# Patient Record
Sex: Male | Born: 1955 | Race: White | Hispanic: No | Marital: Married | Smoking: Never smoker
Health system: Southern US, Community
[De-identification: ages and names within clinical notes are randomized; demographics above are authoritative.]

## PROBLEM LIST (undated history)

## (undated) DIAGNOSIS — R972 Elevated prostate specific antigen [PSA]: Secondary | ICD-10-CM

## (undated) DIAGNOSIS — G5620 Lesion of ulnar nerve, unspecified upper limb: Secondary | ICD-10-CM

## (undated) DIAGNOSIS — K219 Gastro-esophageal reflux disease without esophagitis: Secondary | ICD-10-CM

## (undated) DIAGNOSIS — E785 Hyperlipidemia, unspecified: Secondary | ICD-10-CM

## (undated) DIAGNOSIS — N529 Male erectile dysfunction, unspecified: Secondary | ICD-10-CM

## (undated) DIAGNOSIS — R7301 Impaired fasting glucose: Secondary | ICD-10-CM

## (undated) DIAGNOSIS — M79662 Pain in left lower leg: Secondary | ICD-10-CM

## (undated) DIAGNOSIS — M7989 Other specified soft tissue disorders: Secondary | ICD-10-CM

## (undated) DIAGNOSIS — N138 Other obstructive and reflux uropathy: Secondary | ICD-10-CM

## (undated) DIAGNOSIS — J3089 Other allergic rhinitis: Secondary | ICD-10-CM

## (undated) DIAGNOSIS — N401 Enlarged prostate with lower urinary tract symptoms: Secondary | ICD-10-CM

## (undated) DIAGNOSIS — Z86010 Personal history of colonic polyps: Secondary | ICD-10-CM

## (undated) HISTORY — DX: Other specified soft tissue disorders: M79.89

## (undated) HISTORY — DX: Elevated prostate specific antigen (PSA): R97.20

## (undated) HISTORY — DX: Gastro-esophageal reflux disease without esophagitis: K21.9

## (undated) HISTORY — DX: Lesion of ulnar nerve, unspecified upper limb: G56.20

## (undated) HISTORY — DX: Other allergic rhinitis: J30.89

## (undated) HISTORY — DX: Male erectile dysfunction, unspecified: N52.9

## (undated) HISTORY — PX: COLONOSCOPY: SHX174

## (undated) HISTORY — DX: Other obstructive and reflux uropathy: N40.1

## (undated) HISTORY — DX: Personal history of colonic polyps: Z86.010

## (undated) HISTORY — DX: Hyperlipidemia, unspecified: E78.5

## (undated) HISTORY — DX: Pain in left lower leg: M79.662

## (undated) HISTORY — DX: Other obstructive and reflux uropathy: N13.8

## (undated) HISTORY — PX: LUMBAR DISC SURGERY: SHX700

## (undated) HISTORY — DX: Impaired fasting glucose: R73.01

---

## 2004-04-15 ENCOUNTER — Ambulatory Visit: Payer: Self-pay | Admitting: *Deleted

## 2007-04-24 ENCOUNTER — Ambulatory Visit (HOSPITAL_COMMUNITY): Admission: RE | Admit: 2007-04-24 | Discharge: 2007-04-24 | Payer: Self-pay | Admitting: Family Medicine

## 2007-06-14 ENCOUNTER — Ambulatory Visit (HOSPITAL_COMMUNITY): Admission: RE | Admit: 2007-06-14 | Discharge: 2007-06-15 | Payer: Self-pay | Admitting: Neurosurgery

## 2008-06-17 ENCOUNTER — Telehealth: Payer: Self-pay | Admitting: Internal Medicine

## 2008-07-26 DIAGNOSIS — Z86010 Personal history of colonic polyps: Secondary | ICD-10-CM

## 2008-07-26 DIAGNOSIS — Z860101 Personal history of adenomatous and serrated colon polyps: Secondary | ICD-10-CM

## 2008-07-26 HISTORY — DX: Personal history of colonic polyps: Z86.010

## 2008-07-26 HISTORY — DX: Personal history of adenomatous and serrated colon polyps: Z86.0101

## 2008-08-05 ENCOUNTER — Telehealth (INDEPENDENT_AMBULATORY_CARE_PROVIDER_SITE_OTHER): Payer: Self-pay | Admitting: *Deleted

## 2008-08-08 ENCOUNTER — Encounter: Payer: Self-pay | Admitting: Internal Medicine

## 2008-08-08 ENCOUNTER — Ambulatory Visit: Payer: Self-pay | Admitting: Internal Medicine

## 2008-08-13 ENCOUNTER — Encounter: Payer: Self-pay | Admitting: Internal Medicine

## 2010-12-08 NOTE — Op Note (Signed)
NAME:  Steve Cunningham, Steve Cunningham NO.:  [id number]   MEDICAL RECORD NO.:  [id number]          PATIENT TYPE:  OIB   LOCATION:  3021                         FACILITY:  [facility]   PHYSICIAN:  Payton Doughty, M.D.      DATE OF BIRTH:  [birth year]   DATE OF PROCEDURE:  06/14/2007  DATE OF DISCHARGE:  06/15/2007                               OPERATIVE REPORT   PREOPERATIVE DIAGNOSIS:  Foraminal disk on the left side at L5-S1.   POSTOPERATIVE DIAGNOSIS:  Foraminal disk on the left side at L5-S1.   PROCEDURE:  Left L5-S1 laminectomy and diskectomy.   ANESTHESIA:  General endotracheal.   PREP:  Sterile Betadine prep and scrub with alcohol wipe.   COMPLICATIONS:  None.   NURSE ASSISTANT:  [place].   DOCTOR ASSISTANT:  Danae Orleans. Venetia Maxon, M.D.   BODY OF TEXT:  This is a 55 year old gentleman with left L5  radiculopathy related to foraminal disk at 5-1 on the left, taken to  operating room, smoothly anesthetized, intubated, placed prone on the  operating table.  Following shave, prep, draped in usual sterile fashion  skin was infiltrated with 1% lidocaine 1:400,000 epinephrine.  The skin  was incised directly over the L5 lamina.  The transverse process of L5,  the pars interarticularis and inferior facet of L5 were identified and x-  ray confirmed correctness of level.  Using a high-speed drill and the  lateral part of the pars interarticularis and the superior portion of  the inferior articular facet of L5 and the superior portion of the  superior articular facet of S1 were removed.  This allowed access to the  foraminal area.  The L5 root was demonstrated, careful dissection  inferolateral to it demonstrated the herniated disk fragment.  Microscope was used and the fragments dissected free and removed without  difficulty.  Wound was irrigated.  Hemostasis assured.  Depo-Medrol  soaked fat was placed in the laminotomy defect.  Successive layers of 0  Vicryl, 2-0 Vicryl, 4-0 Vicryl  were used to close.  Benzoin, Steri-  Strips were placed, made occlusive with Telfa and OpSite.  The patient  returned to recovery room in good condition.           ______________________________  Payton Doughty, M.D.     MWR/MEDQ  D:  06/14/2007  T:  06/15/2007  Job:  [id number]

## 2010-12-08 NOTE — H&P (Signed)
NAME:  Steve Cunningham, Steve Cunningham NO.:  [id number]   MEDICAL RECORD NO.:  [id number]          PATIENT TYPE:  OIB   LOCATION:  3021                         FACILITY:  [facility]   PHYSICIAN:  Payton Doughty, M.D.      DATE OF BIRTH:  [birth year]   DATE OF ADMISSION:  06/14/2007  DATE OF DISCHARGE:  06/15/2007                              HISTORY & PHYSICAL   ADMITTING DIAGNOSIS:  Herniated disk L5-S1 in the frontal position on  the left side.   SERVICE:  Neurosurgery   A very nice 55 year old right-handed white gentleman who about 2 months  began to experience pain in his left leg, noted some numbness down the  lateral aspect of his left foot.  MR showed a foraminal disk off the  left side.  He had foraminal steroid injection that helped transiently,  still has left leg pain.  He is admitted now for a foraminal diskectomy.   MEDICAL HISTORY:  Benign.  He takes Pravachol, Allegra, Motrin,  Neurontin and prednisone, which he has finished.   ALLERGIES:  None.   SURGICAL HISTORY:  None.   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   FAMILY HISTORY:  Parents are 64 in good health with hyperlipidemia   REVIEW OF SYSTEMS:  Remarkable for high cholesterol and his leg pain.   HEENT EXAM:  Within normal limits.  He has good range of motion of the  neck.  CHEST:  Clear.  CARDIAC:  Regular rate and rhythm.  ABDOMEN:  Nontender with no hepatosplenomegaly.  EXTREMITIES:  Without clubbing or cyanosis.  GENITOURINARY:  Deferred.  PERIPHERAL PULSES:  Good.  NEUROLOGICAL:  He is awake, alert and oriented.  His cranial nerves are  intact.  Motor exam shows 5/5 strength throughout the upper and lower  extremities.  Sensory dysesthesia is described in the left L5  distribution.  Reflexes are 1 at the knees, 1 at the ankles.  Straight  leg raise is positive on the left.   MR shows foraminal disk with elevation of the left L5 root at  5-1.   CLINICAL IMPRESSION:  Left L5 radiculopathy related to disk.  He has  failed conservative management.  The plan is for foraminal diskectomy on  the left side.  The risks and benefits have been discussed with him and  he wish to proceed.           ______________________________  Payton Doughty, M.D.     MWR/MEDQ  D:  06/14/2007  T:  06/15/2007  Job:  [id number]

## 2011-05-04 LAB — DIFFERENTIAL
Eosinophils Absolute: 0.1 — ABNORMAL LOW
Eosinophils Relative: 1
Lymphocytes Relative: 29
Lymphs Abs: 1.8
Monocytes Absolute: 0.4
Monocytes Relative: 7

## 2011-05-04 LAB — CBC
Hemoglobin: 14.7
MCV: 91.4
RDW: 13.1
WBC: 6.3

## 2011-05-04 LAB — COMPREHENSIVE METABOLIC PANEL
ALT: 33
BUN: 12
CO2: 29
Chloride: 98
GFR calc Af Amer: >60
Glucose, Bld: 101 — ABNORMAL HIGH
Potassium: 3.6
Total Bilirubin: 0.8
Total Protein: 7.8

## 2011-05-04 LAB — APTT: aPTT: 28

## 2012-02-03 ENCOUNTER — Ambulatory Visit (INDEPENDENT_AMBULATORY_CARE_PROVIDER_SITE_OTHER): Payer: 59 | Admitting: Otolaryngology

## 2012-02-03 DIAGNOSIS — H6120 Impacted cerumen, unspecified ear: Secondary | ICD-10-CM

## 2012-02-03 DIAGNOSIS — H9319 Tinnitus, unspecified ear: Secondary | ICD-10-CM

## 2012-02-03 DIAGNOSIS — H903 Sensorineural hearing loss, bilateral: Secondary | ICD-10-CM

## 2012-11-08 ENCOUNTER — Ambulatory Visit (INDEPENDENT_AMBULATORY_CARE_PROVIDER_SITE_OTHER): Payer: 59 | Admitting: Family Medicine

## 2012-11-08 ENCOUNTER — Encounter: Payer: Self-pay | Admitting: Family Medicine

## 2012-11-08 VITALS — BP 122/72 | HR 72 | Temp 97.6°F | Wt 178.0 lb

## 2012-11-08 DIAGNOSIS — E785 Hyperlipidemia, unspecified: Secondary | ICD-10-CM

## 2012-11-08 DIAGNOSIS — Z0000 Encounter for general adult medical examination without abnormal findings: Secondary | ICD-10-CM

## 2012-11-08 DIAGNOSIS — N529 Male erectile dysfunction, unspecified: Secondary | ICD-10-CM | POA: Insufficient documentation

## 2012-11-08 DIAGNOSIS — Z125 Encounter for screening for malignant neoplasm of prostate: Secondary | ICD-10-CM

## 2012-11-08 DIAGNOSIS — Z0389 Encounter for observation for other suspected diseases and conditions ruled out: Secondary | ICD-10-CM

## 2012-11-08 MED ORDER — PRAVASTATIN SODIUM 40 MG PO TABS: 40.0000 mg | ORAL_TABLET | Freq: Every day | ORAL | Status: DC

## 2012-11-08 MED ORDER — FLUTICASONE PROPIONATE 50 MCG/ACT NA SUSP: NASAL | Status: DC

## 2012-11-08 MED ORDER — SILDENAFIL CITRATE 100 MG PO TABS: 50.0000 mg | ORAL_TABLET | Freq: Every day | ORAL | Status: DC | PRN

## 2012-11-08 MED ORDER — LORATADINE 10 MG PO TABS: 10.0000 mg | ORAL_TABLET | Freq: Every day | ORAL | Status: DC

## 2012-11-08 NOTE — Assessment & Plan Note (Signed)
Will check testosterone level with upcoming fasting labs. Viagra 100mg , 1/2-1 qd prn, #5, RF x 5--given today (printed).

## 2012-11-08 NOTE — Assessment & Plan Note (Signed)
Continue current med, rx RFd. He'll get fasting health panel labs, PSA, and testosterone level at his earliest convenience at [facility] in [place]. He'll then make appt for CPE here.

## 2012-11-08 NOTE — Progress Notes (Signed)
Office Note 11/08/2012  CC:  Chief Complaint  Patient presents with  . Establish Care    refill cholesterol med    HPI:  Steve Cunningham is a 57 y.o. White male who is here to establish care. Patient's most recent primary MD: Dr. Lilyan Punt. Old records were not reviewed prior to or during today's visit.  Pt feeling well.  His only complaint is some intermittent erectile dysfunction.  Intermittent decreased libido, energy level decline, and moodiness. He wonders about his testosterone level, has never had it tested.   Last labs reviewed (09/2011): Tchol 175, trigs 141, HDL47, LDL 100. TSH 4.3, PSA 1.19.  Has been doing well on pravastatin.  He is overdue for CPE.  Past Medical History  Diagnosis Date  . Hyperlipidemia     Past Surgical History  Procedure Laterality Date  . Lumbar disc surgery  approx 2011    L5-S1 (left); Dr. Channing Mutters    Family History  Problem Relation Age of Onset  . Hyperlipidemia Mother   . Hypertension Mother   . Hyperlipidemia Father   . Hypertension Father     History   Social History  . Marital Status: Married    Spouse Name: N/A    Number of Children: N/A  . Years of Education: N/A   Occupational History  . Not on file.   Social History Main Topics  . Smoking status: Never Smoker   . Smokeless tobacco: Never Used  . Alcohol Use: No  . Drug Use: No  . Sexually Active: Not on file   Other Topics Concern  . Not on file   Social History Narrative   Married, 1 son and 1 daughter, both college age.   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   College/med school: [employer].   No T/A/Ds.          Outpatient Encounter Prescriptions as of 11/08/2012  Medication Sig Dispense Refill  . fluticasone (FLONASE) 50 MCG/ACT nasal spray Place 2 sprays into each nostril daily.  48 g  4  . loratadine (CLARITIN) 10 MG tablet Take 1 tablet (10 mg total) by mouth daily.  90 tablet  4  . pravastatin  (PRAVACHOL) 40 MG tablet Take 1 tablet (40 mg total) by mouth daily.  90 tablet  4  . [DISCONTINUED] fluticasone (FLONASE) 50 MCG/ACT nasal spray Place 2 sprays into each nostril daily.      . [DISCONTINUED] loratadine (CLARITIN) 10 MG tablet Take 10 mg by mouth daily.      . [DISCONTINUED] pravastatin (PRAVACHOL) 40 MG tablet Take 40 mg by mouth daily.      . sildenafil (VIAGRA) 100 MG tablet Take 0.5-1 tablets (50-100 mg total) by mouth daily as needed for erectile dysfunction.  5 tablet  11   No facility-administered encounter medications on file as of 11/08/2012.    No Known Allergies  ROS Review of Systems  Constitutional: Negative for fever and fatigue.  HENT: Negative for congestion and sore throat.   Eyes: Negative for visual disturbance.  Respiratory: Negative for cough.   Cardiovascular: Negative for chest pain.  Gastrointestinal: Negative for nausea and abdominal pain.  Genitourinary: Negative for dysuria.  Musculoskeletal: Negative for back pain and joint swelling.  Skin: Negative for rash.  Neurological: Negative for weakness and headaches.  Hematological: Negative for adenopathy.    PE; Blood pressure 122/72, pulse 72, temperature 97.6 F (36.4 C), temperature source Temporal, weight 178 lb (80.74 kg), SpO2 98.00%. Gen: Alert,  well appearing.  Patient is oriented to person, place, time, and situation. CV: RRR, no m/r/g.   LUNGS: CTA bilat, nonlabored resps, good aeration in all lung fields. EXT: no clubbing, cyanosis, or edema.   Pertinent labs:  None today (see HPI)  ASSESSMENT AND PLAN:   New Pt, establishing care.  Hyperlipidemia Continue current med, rx RFd. He'll get fasting health panel labs, PSA, and testosterone level at his earliest convenience at [facility] in [place]. He'll then make appt for CPE here.  Erectile dysfunction Will check testosterone level with upcoming fasting labs. Viagra 100mg , 1/2-1 qd prn, #5, RF x 5--given today  (printed).  An After Visit Summary was printed and given to the patient.  Return for Return at your earliest convenience for CPE.

## 2013-07-31 ENCOUNTER — Encounter: Payer: Self-pay | Admitting: Internal Medicine

## 2013-12-12 ENCOUNTER — Other Ambulatory Visit: Payer: Self-pay | Admitting: Family Medicine

## 2014-01-01 ENCOUNTER — Encounter: Payer: Self-pay | Admitting: Internal Medicine

## 2014-06-13 ENCOUNTER — Encounter: Payer: Self-pay | Admitting: Family Medicine

## 2014-06-13 ENCOUNTER — Ambulatory Visit (INDEPENDENT_AMBULATORY_CARE_PROVIDER_SITE_OTHER): Payer: 59 | Admitting: Family Medicine

## 2014-06-13 ENCOUNTER — Other Ambulatory Visit: Payer: Self-pay | Admitting: Family Medicine

## 2014-06-13 VITALS — BP 123/80 | HR 68 | Temp 97.8°F | Resp 16 | Ht 75.0 in | Wt 174.0 lb

## 2014-06-13 DIAGNOSIS — L819 Disorder of pigmentation, unspecified: Secondary | ICD-10-CM | POA: Insufficient documentation

## 2014-06-13 DIAGNOSIS — Z125 Encounter for screening for malignant neoplasm of prostate: Secondary | ICD-10-CM

## 2014-06-13 DIAGNOSIS — E785 Hyperlipidemia, unspecified: Secondary | ICD-10-CM

## 2014-06-13 DIAGNOSIS — N5201 Erectile dysfunction due to arterial insufficiency: Secondary | ICD-10-CM

## 2014-06-13 DIAGNOSIS — Z0000 Encounter for general adult medical examination without abnormal findings: Secondary | ICD-10-CM

## 2014-06-13 DIAGNOSIS — J309 Allergic rhinitis, unspecified: Secondary | ICD-10-CM

## 2014-06-13 DIAGNOSIS — J3089 Other allergic rhinitis: Secondary | ICD-10-CM

## 2014-06-13 MED ORDER — SILDENAFIL CITRATE 100 MG PO TABS: 50.0000 mg | ORAL_TABLET | Freq: Every day | ORAL | Status: DC | PRN

## 2014-06-13 MED ORDER — LORATADINE 10 MG PO TABS: 10.0000 mg | ORAL_TABLET | Freq: Every day | ORAL | Status: DC

## 2014-06-13 MED ORDER — PRAVASTATIN SODIUM 40 MG PO TABS: 40.0000 mg | ORAL_TABLET | Freq: Every day | ORAL | Status: DC

## 2014-06-13 NOTE — Progress Notes (Signed)
OFFICE VISIT  06/13/2014   CC:  Chief Complaint  Patient presents with  . Medication Refill   HPI:    Patient is a 58 y.o. Caucasian male who presents for routine med f/u, needs RFs of pravastatin, viagra, and claritin. Has a skin lesion he wants me to take a look at.  Left shoulder, very dark pigment, no known growth or other changes lately. 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  Says he is doing well with claritin year round, only takes flonase in spring and fall.  Compliant with pravachol, says he got labs in the spring on "Doctor's Day" and they were fine. However, he asks for fasting lab panel to be ordered for future so [facility] can [facility] and then return here in near future for his CPE.  Asks for more viagra, says this med helps very well for his ED.   Past Medical History  Diagnosis Date  . Hyperlipidemia   . Erectile dysfunction   . Perennial allergic rhinitis     Past Surgical History  Procedure Laterality Date  . Lumbar disc surgery  approx 2011    L5-S1 (left); Dr. Channing Muttersoy    Outpatient Prescriptions Prior to Visit  Medication Sig Dispense Refill  . fluticasone (FLONASE) 50 MCG/ACT nasal spray Place 2 sprays into each nostril daily. 48 g 4  . loratadine (CLARITIN) 10 MG tablet Take 1 tablet (10 mg total) by mouth daily. 90 tablet 4  . pravastatin (PRAVACHOL) 40 MG tablet Take 1 tablet (40 mg total) by mouth daily. 90 tablet 4  . sildenafil (VIAGRA) 100 MG tablet Take 0.5-1 tablets (50-100 mg total) by mouth daily as needed for erectile dysfunction. 5 tablet 11   No facility-administered medications prior to visit.    No Known Allergies  ROS As per HPI  PE: Blood pressure 123/80, pulse 68, temperature 97.8 F (36.6 C), temperature source Temporal, resp. rate 16, height 6\' 3"  (1.905 m), weight 174 lb (78.926 kg), SpO2 100 %. Gen: Alert, well appearing.  Patient is oriented to person, place,  time, and situation. SKIN: left chest wall, upper pectoralis region with 1 mm oval, black macule.  LABS:  None today  IMPRESSION AND PLAN:  Atypical pigmented skin lesion Removed entirely by punch biopsy today but margin was about 1mm at the most. If returns + for melanoma then he'll need additional marginal excision/dermatologist referral.  Used Betadine for cleansing and 1% Lidocaine with epinephrine for anesthetic, with sterile technique a 4 mm punch biopsy was used to encompass the entire lesion and remove the lesion + a 1mm circumferential border around the lesion.  Hemostasis was obtained by pressure and wound was sutured with 1 4-0 ethilon suture. Dressing was applied and wound care instructions provided. Be alert for any signs of cutaneous infection. The specimen is labeled and sent to pathology for evaluation. The procedure was well tolerated without complications.   Hyperlipidemia The current medical regimen is effective;  continue present plan and medications. RF's done. FLP and CMET future ordered.  Erectile dysfunction Viagra helpful. RF'd this med today.  Perennial allergic rhinitis The current medical regimen is effective;  continue present plan and medications.    An After Visit Summary was printed and given to the patient.  FOLLOW UP: Return for return for CPE at your convenience. Health panel + PSA ordered as future labs to be obtained at [facility] in [place].

## 2014-06-13 NOTE — Assessment & Plan Note (Signed)
Removed entirely by punch biopsy today but margin was about 1mm at the most. If returns + for melanoma then he'll need additional marginal excision/dermatologist referral.  Used Betadine for cleansing and 1% Lidocaine with epinephrine for anesthetic, with sterile technique a 4 mm punch biopsy was used to encompass the entire lesion and remove the lesion + a 1mm circumferential border around the lesion.  Hemostasis was obtained by pressure and wound was sutured with 1 4-0 ethilon suture. Dressing was applied and wound care instructions provided. Be alert for any signs of cutaneous infection. The specimen is labeled and sent to pathology for evaluation. The procedure was well tolerated without complications.

## 2014-06-13 NOTE — Progress Notes (Signed)
Pre visit review using our clinic review tool, if applicable. No additional management support is needed unless otherwise documented below in the visit note. 

## 2014-06-13 NOTE — Assessment & Plan Note (Signed)
Viagra helpful. RF'd this med today.

## 2014-06-13 NOTE — Assessment & Plan Note (Signed)
The current medical regimen is effective;  continue present plan and medications.  

## 2014-06-13 NOTE — Assessment & Plan Note (Signed)
The current medical regimen is effective;  continue present plan and medications. RF's done. FLP and CMET future ordered.

## 2015-04-01 ENCOUNTER — Encounter: Payer: Self-pay | Admitting: Internal Medicine

## 2015-05-16 ENCOUNTER — Other Ambulatory Visit: Payer: Self-pay

## 2015-05-16 MED ORDER — PENICILLIN V POTASSIUM 500 MG PO TABS: ORAL_TABLET | ORAL | Status: DC

## 2015-05-21 ENCOUNTER — Ambulatory Visit (AMBULATORY_SURGERY_CENTER): Payer: Self-pay

## 2015-05-21 VITALS — Ht 74.0 in | Wt 177.0 lb

## 2015-05-21 DIAGNOSIS — Z86010 Personal history of colonic polyps: Secondary | ICD-10-CM

## 2015-05-21 MED ORDER — NA SULFATE-K SULFATE-MG SULF 17.5-3.13-1.6 GM/177ML PO SOLN: 1.0000 | Freq: Once | ORAL | Status: DC

## 2015-05-21 NOTE — Progress Notes (Signed)
No egg or soy allergies Not on home 02 No previous anesthesia complications No diet or weight loss meds 

## 2015-06-05 ENCOUNTER — Encounter: Payer: 59 | Admitting: Internal Medicine

## 2015-06-16 ENCOUNTER — Other Ambulatory Visit: Payer: Self-pay | Admitting: Family Medicine

## 2015-08-27 ENCOUNTER — Ambulatory Visit (AMBULATORY_SURGERY_CENTER): Payer: 59 | Admitting: Internal Medicine

## 2015-08-27 ENCOUNTER — Encounter: Payer: Self-pay | Admitting: Internal Medicine

## 2015-08-27 VITALS — BP 118/73 | HR 55 | Temp 97.3°F | Resp 17 | Ht 74.0 in | Wt 177.0 lb

## 2015-08-27 DIAGNOSIS — Z86010 Personal history of colonic polyps: Secondary | ICD-10-CM

## 2015-08-27 MED ORDER — SODIUM CHLORIDE 0.9 % IV SOLN: 500.0000 mL | INTRAVENOUS | Status: DC

## 2015-08-27 NOTE — Patient Instructions (Signed)
YOU HAD AN ENDOSCOPIC PROCEDURE TODAY AT THE [facility]:   Refer to the procedure report that was given to you for any specific questions about what was found during the examination.  If the procedure report does not answer your questions, please call your gastroenterologist to clarify.  If you requested that your care partner not be given the details of your procedure findings, then the procedure report has been included in a sealed envelope for you to review at your convenience later.  YOU SHOULD EXPECT: Some feelings of bloating in the abdomen. Passage of more gas than usual.  Walking can help get rid of the air that was put into your GI tract during the procedure and reduce the bloating. If you had a lower endoscopy (such as a colonoscopy or flexible sigmoidoscopy) you may notice spotting of blood in your stool or on the toilet paper. If you underwent a bowel prep for your procedure, you may not have a normal bowel movement for a few days.  Please Note:  You might notice some irritation and congestion in your nose or some drainage.  This is from the oxygen used during your procedure.  There is no need for concern and it should clear up in a day or so.  SYMPTOMS TO REPORT IMMEDIATELY:   Following lower endoscopy (colonoscopy or flexible sigmoidoscopy):  Excessive amounts of blood in the stool  Significant tenderness or worsening of abdominal pains  Swelling of the abdomen that is new, acute  Fever of 100F or higher    For urgent or emergent issues, a gastroenterologist can be reached at any hour by calling [phone].   DIET: Your first meal following the procedure should be a small meal and then it is ok to progress to your normal diet. Heavy or fried foods are harder to digest and may make you feel nauseous or bloated.  Likewise, meals heavy in dairy and vegetables can increase bloating.  Drink plenty of fluids but you should avoid alcoholic beverages for 24  hours.  ACTIVITY:  You should plan to take it easy for the rest of today and you should NOT DRIVE or use heavy machinery until tomorrow (because of the sedation medicines used during the test).    FOLLOW UP: Our staff will call the number listed on your records the next business day following your procedure to check on you and address any questions or concerns that you may have regarding the information given to you following your procedure. If we do not reach you, we will leave a message.  However, if you are feeling well and you are not experiencing any problems, there is no need to return our call.  We will assume that you have returned to your regular daily activities without incident.  If any biopsies were taken you will be contacted by phone or by letter within the next 1-3 weeks.  Please call us at [phone] if you have not heard about the biopsies in 3 weeks.    SIGNATURES/CONFIDENTIALITY: You and/or your care partner have signed paperwork which will be entered into your electronic medical record.  These signatures attest to the fact that that the information above on your After Visit Summary has been reviewed and is understood.  Full responsibility of the confidentiality of this discharge information lies with you and/or your care-partner.   Resume medications. Information given on diverticuclosis and high fiber diet.

## 2015-08-27 NOTE — Progress Notes (Signed)
A/ox3 pleased with MAC, report to Sheila RN 

## 2015-08-27 NOTE — Op Note (Signed)
[facility] [address] [place], [address]   COLONOSCOPY PROCEDURE REPORT  PATIENT: Steve Cunningham, Steve Cunningham  MR#: [id number] BIRTHDATE: 1955/09/18 , 59  yrs. old GENDER: male ENDOSCOPIST: Roxy Cedar, MD REFERRED AO:[id number] Program Recall PROCEDURE DATE:  08/27/2015 PROCEDURE:   Colonoscopy, surveillance First Screening Colonoscopy - Avg.  risk and is 50 yrs.  old or older - No.  Prior Negative Screening - Now for repeat screening. N/A  History of Adenoma - Now for follow-up colonoscopy & has been > or = to 3 yrs.  Yes hx of adenoma.  Has been 3 or more years since last colonoscopy.  Polyps removed today? No Recommend repeat exam, <10 yrs? No ASA CLASS:   Class II INDICATIONS:Surveillance due to prior colonic neoplasia and PH Colon Adenoma.  . Index examination January 2010 with diminutive tubular adenoma MEDICATIONS: Monitored anesthesia care and Propofol 250 mg IV  DESCRIPTION OF PROCEDURE:   After the risks benefits and alternatives of the procedure were thoroughly explained, informed consent was obtained.  The digital rectal exam revealed no abnormalities of the rectum.   The LB CF-H180AL Loaner [id number] endoscope was introduced through the anus and advanced to the cecum, which was identified by both the appendix and ileocecal valve. No adverse events experienced.   The quality of the prep was good.  (Suprep was used)  The instrument was then slowly withdrawn as the colon was fully examined. Estimated blood loss is zero unless otherwise noted in this procedure report.   COLON FINDINGS: There was mild diverticulosis noted in the sigmoid colon.   The examination was otherwise normal. No polyps. Retroflexed views revealed internal hemorrhoids. The time to cecum = 2.5 Withdrawal time = 18.6   The scope was withdrawn and the procedure completed. COMPLICATIONS: There were no immediate complications.  ENDOSCOPIC IMPRESSION: 1.   Mild diverticulosis was  noted in the sigmoid colon 2.   The examination was otherwise normal  RECOMMENDATIONS: 1. Continue current colorectal surveillance recommendations with a repeat colonoscopy in 10 years.  eSigned:  Roxy Cedar, MD 08/27/2015 9:24 AM   cc: Simone Curia, Md and Earley Favor, MD

## 2015-08-28 ENCOUNTER — Telehealth: Payer: Self-pay | Admitting: *Deleted

## 2015-08-28 NOTE — Telephone Encounter (Signed)
  Follow up Call-  Call back number 08/27/2015  Post procedure Call Back phone  # [phone]  Permission to leave phone message Yes    No message left; this number was to [facility]

## 2015-09-23 MED FILL — PRAVASTATIN NA 40 MG TAB: 40 | 90 days supply | Qty: 90 | Fill #1

## 2015-12-26 MED FILL — PRAVASTATIN NA 40 MG TAB: 40 | 90 days supply | Qty: 90 | Fill #2

## 2016-04-21 MED FILL — PRAVASTATIN NA 40 MG TAB: 40 | 90 days supply | Qty: 90 | Fill #3

## 2016-06-08 ENCOUNTER — Other Ambulatory Visit: Payer: Self-pay | Admitting: *Deleted

## 2016-06-08 MED ORDER — SULFACETAMIDE SODIUM 10 % OP SOLN: 2.0000 [drp] | Freq: Four times a day (QID) | OPHTHALMIC | 0 refills | Status: DC

## 2016-06-21 ENCOUNTER — Other Ambulatory Visit: Payer: Self-pay | Admitting: *Deleted

## 2016-06-21 MED ORDER — AMOXICILLIN-POT CLAVULANATE 875-125 MG PO TABS: 1.0000 | ORAL_TABLET | Freq: Two times a day (BID) | ORAL | 0 refills | Status: DC

## 2016-06-21 NOTE — Progress Notes (Unsigned)
Aug 875

## 2016-06-24 ENCOUNTER — Other Ambulatory Visit: Payer: Self-pay

## 2016-06-24 MED ORDER — PREDNISONE 10 MG PO TABS: ORAL_TABLET | ORAL | 0 refills | Status: DC

## 2016-07-08 DIAGNOSIS — H52223 Regular astigmatism, bilateral: Secondary | ICD-10-CM | POA: Diagnosis not present

## 2016-07-08 DIAGNOSIS — H5213 Myopia, bilateral: Secondary | ICD-10-CM | POA: Diagnosis not present

## 2016-07-08 DIAGNOSIS — H524 Presbyopia: Secondary | ICD-10-CM | POA: Diagnosis not present

## 2016-07-22 ENCOUNTER — Other Ambulatory Visit: Payer: Self-pay | Admitting: Family Medicine

## 2016-07-22 MED FILL — PRAVASTATIN NA 40 MG TAB: 40 | 90 days supply | Qty: 90 | Fill #0

## 2016-07-22 NOTE — Telephone Encounter (Signed)
Will do 1 yr of RF's, but remind pt to try to get in for a fasting physical exam sometime in the next year.--thx

## 2016-07-22 NOTE — Telephone Encounter (Signed)
RF request for pravastatin LOV: 06/13/14 Next ov: None Last written: 06/16/15 #90 w/ 4RF  Please advise. Thanks.

## 2016-07-23 NOTE — Telephone Encounter (Signed)
Pt advised and voiced understanding.   

## 2016-10-01 ENCOUNTER — Other Ambulatory Visit: Payer: Self-pay

## 2016-10-01 MED ORDER — AMOXICILLIN-POT CLAVULANATE 875-125 MG PO TABS: 1.0000 | ORAL_TABLET | Freq: Two times a day (BID) | ORAL | 0 refills | Status: DC

## 2016-10-01 MED ORDER — PREDNISONE 20 MG PO TABS: ORAL_TABLET | ORAL | 0 refills | Status: DC

## 2016-10-14 LAB — HEPATIC FUNCTION PANEL
ALK PHOS: 36 (ref 25–125)
ALT: 40 (ref 10–40)
AST: 32 (ref 14–40)
Bilirubin, Total: 0.7

## 2016-10-14 LAB — LIPID PANEL
CHOLESTEROL: 190 (ref 0–200)
HDL: 43 (ref 35–70)
LDL CALC: 100
Triglycerides: 234 — AB (ref 40–160)

## 2016-10-14 LAB — TSH: TSH: 4.42 (ref <=5.90)

## 2016-10-14 LAB — BASIC METABOLIC PANEL
BUN: 18 (ref 4–21)
Creatinine: 1.1 (ref 0.6–1.3)
Glucose: 98
POTASSIUM: 3.6 (ref 3.4–5.3)
Sodium: 137 (ref 137–147)

## 2016-10-14 LAB — PSA: PSA: 1.89

## 2016-10-14 LAB — CBC AND DIFFERENTIAL
HEMATOCRIT: 40 — AB (ref 41–53)
HEMOGLOBIN: 14 (ref 13.5–17.5)
Platelets: 226 (ref 150–399)
WBC: 5.1

## 2016-10-22 MED FILL — PRAVASTATIN NA 40 MG TAB: 40 | 90 days supply | Qty: 90 | Fill #1

## 2017-01-13 ENCOUNTER — Ambulatory Visit (INDEPENDENT_AMBULATORY_CARE_PROVIDER_SITE_OTHER): Payer: 59 | Admitting: Family Medicine

## 2017-01-13 ENCOUNTER — Encounter: Payer: Self-pay | Admitting: Family Medicine

## 2017-01-13 VITALS — BP 108/65 | HR 69 | Temp 97.7°F | Resp 16 | Ht 75.0 in | Wt 176.2 lb

## 2017-01-13 DIAGNOSIS — K219 Gastro-esophageal reflux disease without esophagitis: Secondary | ICD-10-CM

## 2017-01-13 DIAGNOSIS — N5201 Erectile dysfunction due to arterial insufficiency: Secondary | ICD-10-CM

## 2017-01-13 DIAGNOSIS — G5622 Lesion of ulnar nerve, left upper limb: Secondary | ICD-10-CM | POA: Diagnosis not present

## 2017-01-13 DIAGNOSIS — Z0000 Encounter for general adult medical examination without abnormal findings: Secondary | ICD-10-CM | POA: Diagnosis not present

## 2017-01-13 DIAGNOSIS — E782 Mixed hyperlipidemia: Secondary | ICD-10-CM

## 2017-01-13 DIAGNOSIS — R400 Somnolence: Secondary | ICD-10-CM

## 2017-01-13 DIAGNOSIS — Z125 Encounter for screening for malignant neoplasm of prostate: Secondary | ICD-10-CM | POA: Diagnosis not present

## 2017-01-13 MED ORDER — MODAFINIL 200 MG PO TABS: ORAL_TABLET | ORAL | 0 refills | Status: DC

## 2017-01-13 MED ORDER — RANITIDINE HCL 300 MG PO CAPS: ORAL_CAPSULE | ORAL | 3 refills | Status: DC

## 2017-01-13 MED ORDER — TADALAFIL 20 MG PO TABS: 20.0000 mg | ORAL_TABLET | Freq: Every day | ORAL | 11 refills | Status: DC | PRN

## 2017-01-13 MED ORDER — PRAVASTATIN SODIUM 40 MG PO TABS: 40.0000 mg | ORAL_TABLET | Freq: Every day | ORAL | 3 refills | Status: DC

## 2017-01-13 MED FILL — raNITIdine HCL 300 MG TABS: 300 | 90 days supply | Qty: 90 | Fill #0

## 2017-01-13 MED FILL — PRAVASTATIN NA 40 MG TAB: 40 | 90 days supply | Qty: 90 | Fill #0

## 2017-01-13 NOTE — Progress Notes (Signed)
OFFICE VISIT  01/13/2017   CC:  Chief Complaint  Patient presents with  . Follow-up    HLD and GERD, pt brought in labs done 6 weeks ago   HPI:    Patient is a 61 y.o. Caucasian male who presents for f/u hypercholesterolemia and GERD. Has recent labs: all normal including CBC, CMET, FLP, TSH, and PSA.  HLD: tolerating prava 40mg  qd well. GERD: recent dental visit showed some enamel erosion c/w acid reflux changes. Pt decreased beer intake from 2 per night to 1 per night and has started ranitidine 300 mg qd and this is helping well.  ED: pt wants to try cialis 20mg  qd prn Excessive daytime somnolence: esp when driving.  Denies snoring or hx of witnessed apnea in sleep. He requests we try a prn dosing of provigil for when he has to drive long [id number]--782 mg dose.  Has chronic L>>R numbness and tingling in ulnar distribution--worse during sleep and upon awakening in AM, also when typing and playing stringed instruments.  Asks for referral to specialists to get input on possible treatments. He has tried an elbow brace during sleep to keep L arms extended but it kept slipping up arm and he stopped trying.   Past Medical History:  Diagnosis Date  . Allergy   . Erectile dysfunction   . GERD (gastroesophageal reflux disease)   . History of adenomatous polyp of colon 2010  . Hyperlipidemia   . Perennial allergic rhinitis     Past Surgical History:  Procedure Laterality Date  . COLONOSCOPY  08/08/2008   2010 adenomatous polyp; repeat 08/27/15 no polyps (diverticulosis)--recall 08/2025.  [facility] LUMBAR DISC SURGERY  approx 2011   L5-S1 (left); Dr. Channing Mutters    Outpatient Medications Prior to Visit  Medication Sig Dispense Refill  . fluticasone (FLONASE) 50 MCG/ACT nasal spray Place 2 sprays into each nostril daily. 48 g 4  . loratadine (CLARITIN) 10 MG tablet Take 1 tablet (10 mg total) by mouth daily. 90 tablet 4  . pravastatin (PRAVACHOL) 40 MG tablet TAKE 1 TABLET BY MOUTH ONCE DAILY 90  tablet 3  . amoxicillin-clavulanate (AUGMENTIN) 875-125 MG tablet Take 1 tablet by mouth 2 (two) times daily. (Patient not taking: Reported on 01/13/2017) 20 tablet 0  . ibuprofen (ADVIL,MOTRIN) 200 MG tablet Take 600 mg by mouth 3 (three) times daily.    . predniSONE (DELTASONE) 20 MG tablet Take 3 tablet by mouth for 3 days, then take 2 tablets by mouth for 3 days, then take 1 tablet by mouth for 2 days (Patient not taking: Reported on 01/13/2017) 17 tablet 0  . sildenafil (VIAGRA) 100 MG tablet Take 0.5-1 tablets (50-100 mg total) by mouth daily as needed for erectile dysfunction. (Patient not taking: Reported on 01/13/2017) 6 tablet 11  . sulfacetamide (BLEPH-10) 10 % ophthalmic solution Place 2 drops into the left eye 4 (four) times daily. (Patient not taking: Reported on 01/13/2017) 15 mL 0   No facility-administered medications prior to visit.     No Known Allergies  ROS As per HPI  PE: Blood pressure 108/65, pulse 69, temperature 97.7 F (36.5 C), temperature source Oral, resp. rate 16, height 6\' 3"  (1.905 m), weight 176 lb 4 oz (79.9 kg), SpO2 98 %. Gen: Alert, well appearing.  Patient is oriented to person, place, time, and situation. AFFECT: pleasant, lucid thought and speech. NFA:OZHY: no injection, icteris, swelling, or exudate.  EOMI, PERRLA. Mouth: lips without lesion/swelling.  Oral mucosa pink and moist. Oropharynx without erythema,  exudate, or swelling.  CV: RRR, no m/r/g.   LUNGS: CTA bilat, nonlabored resps, good aeration in all lung fields. EXT: no clubbing, cyanosis, or edema.   LABS:   Lab Results  Component Value Date   WBC 5.1 10/14/2016   HGB 14.0 10/14/2016   HCT 40 (A) 10/14/2016   MCV 91.4 06/14/2007   PLT 226 10/14/2016   Lab Results  Component Value Date   CREATININE 1.1 10/14/2016   BUN 18 10/14/2016   NA 137 10/14/2016   K 3.6 10/14/2016   CL 98 06/14/2007   CO2 29 06/14/2007   Lab Results  Component Value Date   ALT 40 10/14/2016   AST 32  10/14/2016   ALKPHOS 36 10/14/2016   BILITOT 0.8 06/14/2007    IMPRESSION AND PLAN:  1) HLD: good FLP recently, tolerating prava 40mg  qd, which we will continue.  2) GERD: responding moderately well to ranitidine 300mg  qhs and we'll rx this for him today. He'll continue to work on lifestyle/diet modification.  3) Erectile dysfunction: rx for cialis 20mg  qd prn given today.  4) Excessive daytime somnolence: he feels like this is a dangerous issue when he drives long distances. I rx'd provigil 200 mg qd prn for him to use to try to help this.  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  An After Visit Summary was printed and given to the patient.  FOLLOW UP: Return in about 1 year (around 01/13/2018) for annual CPE (fasting).  Signed:  Santiago BumpersPhil Wessie Shanks, MD           01/13/2017

## 2017-01-14 ENCOUNTER — Telehealth: Payer: Self-pay

## 2017-01-14 MED ORDER — SILDENAFIL CITRATE 20 MG PO TABS: ORAL_TABLET | ORAL | 6 refills | Status: DC

## 2017-01-14 MED FILL — SILDENAFIL 20 MG TABLET: 20 | 10 days supply | Qty: 20 | Fill #0

## 2017-01-14 NOTE — Telephone Encounter (Signed)
Patient called stating that Cialis was going to be to expensive and would rather just have generic Viagra 20mg  called in 2 tabs prn to pharmacy.

## 2017-01-14 NOTE — Telephone Encounter (Signed)
OK, viagra eRx'd as per pt request.

## 2017-02-08 DIAGNOSIS — G5623 Lesion of ulnar nerve, bilateral upper limbs: Secondary | ICD-10-CM | POA: Diagnosis not present

## 2017-02-18 DIAGNOSIS — G5623 Lesion of ulnar nerve, bilateral upper limbs: Secondary | ICD-10-CM | POA: Diagnosis not present

## 2017-03-26 HISTORY — PX: OTHER SURGICAL HISTORY: SHX169

## 2017-03-31 DIAGNOSIS — G5622 Lesion of ulnar nerve, left upper limb: Secondary | ICD-10-CM | POA: Diagnosis not present

## 2017-04-11 MED FILL — raNITIdine HCL 300 MG TABS: 300 | 90 days supply | Qty: 90 | Fill #1

## 2017-04-11 MED FILL — PRAVASTATIN NA 40 MG TAB: 40 | 90 days supply | Qty: 90 | Fill #1

## 2017-04-21 ENCOUNTER — Encounter: Payer: Self-pay | Admitting: Family Medicine

## 2017-06-09 ENCOUNTER — Other Ambulatory Visit: Payer: Self-pay | Admitting: Family Medicine

## 2017-06-09 MED ORDER — MUPIROCIN 2 % EX OINT: TOPICAL_OINTMENT | CUTANEOUS | 0 refills | Status: DC

## 2017-06-09 MED ORDER — DOXYCYCLINE HYCLATE 100 MG PO TABS: 100.0000 mg | ORAL_TABLET | Freq: Two times a day (BID) | ORAL | 0 refills | Status: DC

## 2017-06-20 MED FILL — SILDENAFIL 20 MG TABLET: 20 | 10 days supply | Qty: 20 | Fill #1

## 2017-07-20 MED FILL — PRAVASTATIN NA 40 MG TAB: 40 | 90 days supply | Qty: 90 | Fill #2

## 2017-07-20 MED FILL — raNITIdine HCL 300 MG TABS: 300 | 90 days supply | Qty: 90 | Fill #2

## 2017-08-04 ENCOUNTER — Other Ambulatory Visit: Payer: Self-pay | Admitting: *Deleted

## 2017-08-04 MED ORDER — AMOXICILLIN-POT CLAVULANATE 875-125 MG PO TABS: ORAL_TABLET | ORAL | 0 refills | Status: DC

## 2017-10-17 MED FILL — raNITIdine HCL 300 MG TABS: 300 | 90 days supply | Qty: 90 | Fill #3

## 2017-10-17 MED FILL — PRAVASTATIN NA 40 MG TAB: 40 | 90 days supply | Qty: 90 | Fill #3

## 2017-10-20 LAB — CBC AND DIFFERENTIAL
HCT: 41 (ref 41–53)
Hemoglobin: 13.8 (ref 13.5–17.5)
Platelets: 224 (ref 150–399)
WBC: 5

## 2017-10-20 LAB — LIPID PANEL
CHOLESTEROL: 182 (ref 0–200)
HDL: 41 (ref 35–70)
LDL Cholesterol: 111
TRIGLYCERIDES: 151 (ref 40–160)

## 2017-10-20 LAB — HEPATIC FUNCTION PANEL
ALK PHOS: 39 (ref 25–125)
ALT: 40 (ref 10–40)
AST: 35 (ref 14–40)
BILIRUBIN, TOTAL: 0.9

## 2017-10-20 LAB — BASIC METABOLIC PANEL
BUN: 21 (ref 4–21)
CREATININE: 1 (ref 0.6–1.3)
Glucose: 104
Potassium: 3.9 (ref 3.4–5.3)
SODIUM: 139 (ref 137–147)

## 2017-10-20 LAB — PSA: PSA: 2.49

## 2017-10-20 LAB — TSH: TSH: 5.84 (ref 0.41–5.90)

## 2017-12-24 DIAGNOSIS — R7301 Impaired fasting glucose: Secondary | ICD-10-CM

## 2017-12-24 HISTORY — DX: Impaired fasting glucose: R73.01

## 2017-12-28 ENCOUNTER — Ambulatory Visit (INDEPENDENT_AMBULATORY_CARE_PROVIDER_SITE_OTHER): Payer: 59 | Admitting: Family Medicine

## 2017-12-28 ENCOUNTER — Encounter: Payer: Self-pay | Admitting: Family Medicine

## 2017-12-28 VITALS — BP 94/58 | HR 81 | Temp 98.1°F | Resp 16 | Ht 74.5 in | Wt 177.5 lb

## 2017-12-28 DIAGNOSIS — Z125 Encounter for screening for malignant neoplasm of prostate: Secondary | ICD-10-CM

## 2017-12-28 DIAGNOSIS — Z23 Encounter for immunization: Secondary | ICD-10-CM

## 2017-12-28 DIAGNOSIS — R7989 Other specified abnormal findings of blood chemistry: Secondary | ICD-10-CM | POA: Diagnosis not present

## 2017-12-28 DIAGNOSIS — Z1159 Encounter for screening for other viral diseases: Secondary | ICD-10-CM | POA: Diagnosis not present

## 2017-12-28 DIAGNOSIS — Z9189 Other specified personal risk factors, not elsewhere classified: Secondary | ICD-10-CM

## 2017-12-28 DIAGNOSIS — Z0000 Encounter for general adult medical examination without abnormal findings: Secondary | ICD-10-CM

## 2017-12-28 LAB — BASIC METABOLIC PANEL
BUN: 20 mg/dL (ref 6–23)
CO2: 29 meq/L (ref 19–32)
CREATININE: 1.1 mg/dL (ref 0.40–1.50)
Calcium: 9.8 mg/dL (ref 8.4–10.5)
Chloride: 104 mEq/L (ref 96–112)
GFR: 72.41 mL/min (ref >60.00)
Glucose, Bld: 110 mg/dL — ABNORMAL HIGH (ref 70–99)
POTASSIUM: 3.9 meq/L (ref 3.5–5.1)
Sodium: 140 mEq/L (ref 135–145)

## 2017-12-28 LAB — T4, FREE: FREE T4: 0.79 ng/dL (ref 0.60–1.60)

## 2017-12-28 LAB — TSH: TSH: 4.18 u[IU]/mL (ref 0.35–4.50)

## 2017-12-28 MED ORDER — SILDENAFIL CITRATE 20 MG PO TABS: ORAL_TABLET | ORAL | 6 refills | Status: DC

## 2017-12-28 MED ORDER — MODAFINIL 200 MG PO TABS: ORAL_TABLET | ORAL | 0 refills | Status: DC

## 2017-12-28 MED ORDER — RANITIDINE HCL 300 MG PO CAPS
ORAL_CAPSULE | ORAL | 3 refills | Status: DC
Start: 2017-12-28 — End: 2018-11-16

## 2017-12-28 MED ORDER — ZOSTER VAC RECOMB ADJUVANTED 50 MCG/0.5ML IM SUSR: 0.5000 mL | Freq: Once | INTRAMUSCULAR | 1 refills | Status: AC

## 2017-12-28 MED ORDER — LORATADINE 10 MG PO TABS: 10.0000 mg | ORAL_TABLET | Freq: Every day | ORAL | 3 refills | Status: DC

## 2017-12-28 MED ORDER — ATORVASTATIN CALCIUM 40 MG PO TABS: 40.0000 mg | ORAL_TABLET | Freq: Every day | ORAL | 3 refills | Status: DC

## 2017-12-28 MED FILL — SILDENAFIL 20 MG TABLET: 20 | 10 days supply | Qty: 20 | Fill #0

## 2017-12-28 MED FILL — ATORVASTATIN 40 MG TABLET: 40 | 90 days supply | Qty: 90 | Fill #0

## 2017-12-28 MED FILL — CLEAR-ATADINE 10 MG TABLET: 10 | 90 days supply | Qty: 90 | Fill #0

## 2017-12-28 NOTE — Addendum Note (Signed)
Addended by: Regan RakersAY, LAURA K on: 12/28/2017 02:39 PM   Modules accepted: Orders

## 2017-12-28 NOTE — Progress Notes (Signed)
Office Note 12/28/2017  CC:  Chief Complaint  Patient presents with  . Annual Exam    Pt is not fasting.    HPI:  Steve Cunningham is a 62 y.o. White male who is here for annual health maintenance exam. Reviewed labs pt brought in today from 10/20/17: CMET normal except fasting glucose 104, BUN 21, Cr 1.04 (GFR 57 ml/min).  Chol panel normal except LDL 111. PSA 2.49.  TSH 5.8(slightly high). CBC normal.  Exercise 3-4 times/week: cardio and stretching and light wts. Diet: fine. Eyes: annual. Dental: q28mo  Pt says he does not hydrate well. Also has lower urinary tract obstructive sx's gradually worsening last 2 yrs or so. No dysuria. No signif NSAID intake.  No gross hematuria.  Past Medical History:  Diagnosis Date  . Erectile dysfunction   . GERD (gastroesophageal reflux disease)   . History of adenomatous polyp of colon 2010  . Hyperlipidemia   . Perennial allergic rhinitis   . Ulnar nerve neuropathy    Bilat, at elbow    Past Surgical History:  Procedure Laterality Date  . COLONOSCOPY  08/08/2008; 08/2015   2010 adenomatous polyp; repeat 08/27/15 no polyps (diverticulosis)--recall 08/2025.  [facility] LUMBAR DISC SURGERY  approx 2011   L5-S1 (left); Dr. Channing Mutters  . Ulnar neurolysis Left 03/2017   Dr. Lovell Sheehan    Family History  Problem Relation Age of Onset  . Hyperlipidemia Mother   . Hypertension Mother   . Hyperlipidemia Father   . Hypertension Father   . Colon cancer Paternal Grandmother 56    Social History   Socioeconomic History  . Marital status: Married    Spouse name: Not on file  . Number of children: Not on file  . Years of education: Not on file  . Highest education level: Not on file  Occupational History  . Not on file  Social Needs  . Financial resource strain: Not on file  . Food insecurity:    Worry: Not on file    Inability: Not on file  . Transportation needs:    Medical: Not on file    Non-medical: Not on file  Tobacco Use  . Smoking  status: Never Smoker  . Smokeless tobacco: Never Used  Substance and Sexual Activity  . Alcohol use: Yes    Alcohol/week: 4.2 oz    Types: 7 Standard drinks or equivalent per week    Comment: 1 glass wine or beer daily  . Drug use: No  . Sexual activity: Not on file  Lifestyle  . Physical activity:    Days per week: Not on file    Minutes per session: Not on file  . Stress: Not on file  Relationships  . Social connections:    Talks on phone: Not on file    Gets together: Not on file    Attends religious service: Not on file    Active member of club or organization: Not on file    Attends meetings of clubs or organizations: Not on file    Relationship status: Not on file  . Intimate partner violence:    Fear of current or ex partner: Not on file    Emotionally abused: Not on file    Physically abused: Not on file    Forced sexual activity: Not on file  Other Topics Concern  . Not on file  Social History Narrative   Married, 1 son and 1 daughter, both college age.   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   College/med school: [employer].   No T/A/Ds.          Outpatient Medications Prior to Visit  Medication Sig Dispense Refill  . loratadine (CLARITIN) 10 MG tablet Take 1 tablet (10 mg total) by mouth daily. 90 tablet 4  . modafinil (PROVIGIL) 200 MG tablet 1 tab po qd prn 30 tablet 0  . pravastatin (PRAVACHOL) 40 MG tablet Take 1 tablet (40 mg total) by mouth daily. 90 tablet 3  . ranitidine (ZANTAC) 300 MG capsule 1 tab po qd 90 capsule 3  . sildenafil (REVATIO) 20 MG tablet 2 tabs po qd prn 20 tablet 6  . amoxicillin-clavulanate (AUGMENTIN) 875-125 MG tablet One tablet twice a day (Patient not taking: Reported on 12/28/2017) 28 tablet 0  . doxycycline (VIBRA-TABS) 100 MG tablet Take 1 tablet (100 mg total) 2 (two) times daily by mouth. (Patient not taking: Reported on 12/28/2017) 20 tablet 0  . fluticasone (FLONASE) 50 MCG/ACT nasal spray  Place 2 sprays into each nostril daily. (Patient not taking: Reported on 12/28/2017) 48 g 4  . mupirocin ointment (BACTROBAN) 2 % Apply to affected area twie daily (Patient not taking: Reported on 12/28/2017) 30 g 0   No facility-administered medications prior to visit.     No Known Allergies  ROS Review of Systems  Constitutional: Negative for appetite change, chills, fatigue and fever.  HENT: Negative for congestion, dental problem, ear pain and sore throat.   Eyes: Negative for discharge, redness and visual disturbance.  Respiratory: Negative for cough, chest tightness, shortness of breath and wheezing.   Cardiovascular: Negative for chest pain, palpitations and leg swelling.  Gastrointestinal: Negative for abdominal pain, blood in stool, diarrhea, nausea and vomiting.  Genitourinary: Negative for difficulty urinating, dysuria, flank pain, frequency, hematuria and urgency.  Musculoskeletal: Negative for arthralgias, back pain, joint swelling, myalgias and neck stiffness.  Skin: Negative for pallor and rash.  Neurological: Negative for dizziness, speech difficulty, weakness and headaches.  Hematological: Negative for adenopathy. Does not bruise/bleed easily.  Psychiatric/Behavioral: Negative for confusion and sleep disturbance. The patient is not nervous/anxious.     PE; Blood pressure (!) 94/58, pulse 81, temperature 98.1 F (36.7 C), temperature source Oral, resp. rate 16, height 6' 2.5" (1.892 m), weight 177 lb 8 oz (80.5 kg), SpO2 97 %. Body mass index is 22.48 kg/m.  Gen: Alert, well appearing.  Patient is oriented to person, place, time, and situation. AFFECT: pleasant, lucid thought and speech. ENT: Ears: EACs clear, normal epithelium.  TMs with good light reflex and landmarks bilaterally.  Eyes: no injection, icteris, swelling, or exudate.  EOMI, PERRLA. Nose: no drainage or turbinate edema/swelling.  No injection or focal lesion.  Mouth: lips without lesion/swelling.  Oral  mucosa pink and moist.  Dentition intact and without obvious caries or gingival swelling.  Oropharynx without erythema, exudate, or swelling.  Neck: supple/nontender.  No LAD, mass, or TM.  Carotid pulses 2+ bilaterally, without bruits. CV: RRR, no m/r/g.   LUNGS: CTA bilat, nonlabored resps, good aeration in all lung fields. ABD: soft, NT, ND, BS normal.  No hepatospenomegaly or mass.  No bruits. EXT: no clubbing, cyanosis, or edema.  Musculoskeletal: no joint swelling, erythema, warmth, or tenderness.  ROM of all joints intact. Skin - no sores or suspicious lesions or rashes or color changes Rectal exam: negative without mass, lesions or tenderness. Prostate 3+ size, symmetric. w/out tenderness, bogginess, induration, or nodule.   Pertinent labs:  Lab Results  Component Value Date   TSH 4.42 10/14/2016   Lab Results  Component Value Date   WBC 5.1 10/14/2016   HGB 14.0 10/14/2016   HCT 40 (A) 10/14/2016   MCV 91.4 06/14/2007   PLT 226 10/14/2016   Lab Results  Component Value Date   CREATININE 1.1 10/14/2016   BUN 18 10/14/2016   NA 137 10/14/2016   K 3.6 10/14/2016   CL 98 06/14/2007   CO2 29 06/14/2007   Lab Results  Component Value Date   ALT 40 10/14/2016   AST 32 10/14/2016   ALKPHOS 36 10/14/2016   BILITOT 0.8 06/14/2007   Lab Results  Component Value Date   CHOL 190 10/14/2016   Lab Results  Component Value Date   HDL 43 10/14/2016   Lab Results  Component Value Date   LDLCALC 100 10/14/2016   Lab Results  Component Value Date   TRIG 234 (A) 10/14/2016   No results found for: [facility] Lab Results  Component Value Date   PSA 1.89 10/14/2016    ASSESSMENT AND PLAN:   Health maintenance exam: Reviewed age and gender appropriate health maintenance issues (prudent diet, regular exercise, health risks of tobacco and excessive alcohol, use of seatbelts, fire alarms in home, use of sunscreen).  Also reviewed age and gender appropriate health screening  as well as vaccine recommendations. Vaccines: Tdap--given today.  Shingrix--rx given today. Labs: Repeat BMET (mild Cr elevation), TSH, check T4 and T3 as well. Prostate ca screening:  DRE showed hypertrophy today--o/w normal , PSA velocity borderline high (1.9 to 2.5 over 1 yr) but discussed w/pt and decided ok to wait and do annual recheck at this point. Colon ca screening: next colonoscopy due 2027.  Excessive daytime sleepiness: no sign/sx's of OSA or narcolepsy. OK for trial of prn provigil 200mg  qd, #30, no RF.  Regarding cholesterol: will change from pravastatin to atorva 40mg  qd to see if better LDL reduction for him.  An After Visit Summary was printed and given to the patient.  FOLLOW UP:  Return in about 1 year (around 12/29/2018) for annual CPE (fasting).  Signed:  Santiago BumpersPhil Topanga Alvelo, MD           12/28/2017

## 2017-12-28 NOTE — Patient Instructions (Signed)

## 2017-12-29 LAB — HEPATITIS C ANTIBODY
Hepatitis C Ab: NONREACTIVE
SIGNAL TO CUT-OFF: 0.03 (ref <1.00)

## 2017-12-29 LAB — T3: T3, Total: 132 ng/dL (ref 76–181)

## 2017-12-30 ENCOUNTER — Encounter: Payer: Self-pay | Admitting: Family Medicine

## 2017-12-30 ENCOUNTER — Other Ambulatory Visit (INDEPENDENT_AMBULATORY_CARE_PROVIDER_SITE_OTHER): Payer: 59

## 2017-12-30 DIAGNOSIS — R7301 Impaired fasting glucose: Secondary | ICD-10-CM

## 2017-12-30 LAB — HEMOGLOBIN A1C: HEMOGLOBIN A1C: 5.4 % (ref 4.6–6.5)

## 2018-01-09 MED FILL — SILDENAFIL 20 MG TABLET: 20 | 10 days supply | Qty: 20 | Fill #1

## 2018-02-08 MED FILL — raNITIdine HCL 300 MG TABS: 300 | 90 days supply | Qty: 90 | Fill #0

## 2018-02-09 DIAGNOSIS — H43812 Vitreous degeneration, left eye: Secondary | ICD-10-CM | POA: Diagnosis not present

## 2018-05-05 ENCOUNTER — Other Ambulatory Visit: Payer: Self-pay | Admitting: *Deleted

## 2018-05-05 MED ORDER — AMOXICILLIN-POT CLAVULANATE 875-125 MG PO TABS: 1.0000 | ORAL_TABLET | Freq: Two times a day (BID) | ORAL | 0 refills | Status: AC

## 2018-08-04 MED FILL — ATORVASTATIN 40 MG TABLET: 40 | 90 days supply | Qty: 90 | Fill #1

## 2018-08-15 DIAGNOSIS — H35462 Secondary vitreoretinal degeneration, left eye: Secondary | ICD-10-CM | POA: Diagnosis not present

## 2018-08-15 DIAGNOSIS — H52223 Regular astigmatism, bilateral: Secondary | ICD-10-CM | POA: Diagnosis not present

## 2018-08-15 DIAGNOSIS — H43812 Vitreous degeneration, left eye: Secondary | ICD-10-CM | POA: Diagnosis not present

## 2018-08-15 DIAGNOSIS — H5213 Myopia, bilateral: Secondary | ICD-10-CM | POA: Diagnosis not present

## 2018-10-11 MED FILL — ATORVASTATIN 40 MG TABLET: 40 | 90 days supply | Qty: 90 | Fill #2

## 2018-10-25 DIAGNOSIS — M79662 Pain in left lower leg: Secondary | ICD-10-CM

## 2018-10-25 DIAGNOSIS — M7989 Other specified soft tissue disorders: Secondary | ICD-10-CM

## 2018-10-25 HISTORY — DX: Pain in left lower leg: M79.662

## 2018-10-25 HISTORY — DX: Other specified soft tissue disorders: M79.89

## 2018-11-14 ENCOUNTER — Encounter: Payer: Self-pay | Admitting: Family Medicine

## 2018-11-16 ENCOUNTER — Encounter: Payer: Self-pay | Admitting: Family Medicine

## 2018-11-16 ENCOUNTER — Other Ambulatory Visit: Payer: Self-pay

## 2018-11-16 ENCOUNTER — Ambulatory Visit (INDEPENDENT_AMBULATORY_CARE_PROVIDER_SITE_OTHER): Payer: 59 | Admitting: Family Medicine

## 2018-11-16 DIAGNOSIS — M7989 Other specified soft tissue disorders: Secondary | ICD-10-CM

## 2018-11-16 DIAGNOSIS — M79662 Pain in left lower leg: Secondary | ICD-10-CM

## 2018-11-16 NOTE — Progress Notes (Signed)
Virtual Visit via Video Note  I connected with pt  on 11/16/18 at 11:00 AM EDT by a video enabled telemedicine application and verified that I am speaking with the correct person using two identifiers.  Then had to switch over to telephone only b/c of technical difficulties.  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 The patient expressed understanding and agreed to proceed.  Telemedicine/telephone visit is a necessity given the COVID-19 restrictions in place at the current time.  HPI: 63 y/o WM being seen today for swelling and pain of left calf. Started 5-6 d/a with discomfort behind L knee.  Had been doing some jumping jacks more but no strain recalled. Also jumped off his porch recently but denies feeling any significant pain immediately after.   Now, over the last 4 d, L lower leg has become edematous diffusely from prox calf down into ankle. No redness.  The pain is about the same, mild intensity and located mostly in proximal calf. No hx of DVT or PE. No recent prolonged immobilization, no recent surgery, no FH of hypercoagulability.  He does have hx of mild bilat LL venous insufficiency that will wax and wane some with Na intake and prolonged sitting or standing.   ROS: See pertinent positives and negatives per HPI.  Past Medical History:  Diagnosis Date  . Erectile dysfunction   . GERD (gastroesophageal reflux disease)   . History of adenomatous polyp of colon 2010  . Hyperlipidemia   . IFG (impaired fasting glucose) 12/2017   Gluc 110.  HbA1c 5.4%  . Perennial allergic rhinitis   . Ulnar nerve neuropathy    Bilat, at elbow    Past Surgical History:  Procedure Laterality Date  . COLONOSCOPY  08/08/2008; 08/2015   2010 adenomatous polyp; repeat 08/27/15 no polyps  (diverticulosis)--recall 08/2025.  Marland Kitchen. LUMBAR DISC SURGERY  approx 2011   L5-S1 (left); Dr. Channing Muttersoy  . Ulnar neurolysis Left 03/2017   Dr. Lovell SheehanJenkins    Family History  Problem Relation Age of Onset  . Hyperlipidemia Mother   . Hypertension Mother   . Hyperlipidemia Father   . Hypertension Father   . Colon cancer Paternal Grandmother 95     Current Outpatient Medications:  .  atorvastatin (LIPITOR) 40 MG tablet, Take 1 tablet (40 mg total) by mouth daily., Disp: 90 tablet, Rfl: 3 .  famotidine (PEPCID) 40 MG tablet, Take 40 mg by mouth daily. Take 1 tablet daily, Disp: , Rfl:  .  loratadine (CLARITIN) 10 MG tablet, Take 1 tablet (10 mg total) by mouth daily., Disp: 90 tablet, Rfl: 3 .  modafinil (PROVIGIL) 200 MG tablet, 1 tab po qd prn, Disp: 30 tablet, Rfl: 0 .  sildenafil (REVATIO) 20 MG tablet, 2 tabs po qd prn, Disp: 20 tablet, Rfl: 6  EXAM:  VITALS per patient if applicable:There were no vitals taken for this visit.   GENERAL: alert, oriented, appears well and in no acute distress No further exam.  LABS: none today    Chemistry      Component Value Date/Time   NA 140 12/28/2017 1421   NA 139 10/20/2017 0807   K 3.9 12/28/2017 1421   CL 104 12/28/2017 1421   CO2 29 12/28/2017 1421   BUN 20 12/28/2017 1421   BUN 21 10/20/2017 0807   CREATININE 1.10 12/28/2017 1421  GLU 104 10/20/2017 0807      Component Value Date/Time   CALCIUM 9.8 12/28/2017 1421   ALKPHOS 39 10/20/2017 0807   AST 35 10/20/2017 0807   ALT 40 10/20/2017 0807   BILITOT 0.8 06/14/2007 1707     Lab Results  Component Value Date   WBC 5.0 10/20/2017   HGB 13.8 10/20/2017   HCT 41 10/20/2017   MCV 91.4 06/14/2007   PLT 224 10/20/2017   Lab Results  Component Value Date   CHOL 182 10/20/2017   HDL 41 10/20/2017   LDLCALC 111 10/20/2017   TRIG 151 10/20/2017   Lab Results  Component Value Date   HGBA1C 5.4 12/30/2017   Lab Results  Component Value Date   PSA 2.49 10/20/2017   PSA 1.89  10/14/2016   Lab Results  Component Value Date   TSH 4.18 12/28/2017    ASSESSMENT AND PLAN:  Discussed the following assessment and plan:  1) Acute Left lower leg pain and swelling. Need to get LE venous doppler u/s to r/o DVT ASAP. Pt expressed desire to postpone this imaging until tomorrow morning, so that is when we scheduled it. He expressed complete understanding that if he has a DVT then immediate dx would be best. He may have had a popliteal cyst rupture or may have an acute partial gastroc muscle tear.  I discussed the assessment and treatment plan with the patient. The patient was provided an opportunity to ask questions and all were answered. The patient agreed with the plan and demonstrated an understanding of the instructions.   The patient was advised to call back or seek an in-person evaluation if the symptoms worsen or if the condition fails to improve as anticipated.  F/u: To be determined based on results of pending workup.  Signed:  Santiago Bumpers, MD           11/16/2018

## 2018-11-17 ENCOUNTER — Other Ambulatory Visit: Payer: Self-pay | Admitting: Family Medicine

## 2018-11-17 ENCOUNTER — Ambulatory Visit (HOSPITAL_COMMUNITY)
Admission: RE | Admit: 2018-11-17 | Discharge: 2018-11-17 | Disposition: A | Discharge status: Home / Self Care | Payer: 59 | Source: Ambulatory Visit | Attending: Family Medicine | Admitting: Family Medicine

## 2018-11-17 ENCOUNTER — Encounter: Payer: Self-pay | Admitting: Family Medicine

## 2018-11-17 DIAGNOSIS — M7989 Other specified soft tissue disorders: Principal | ICD-10-CM

## 2018-11-17 DIAGNOSIS — M79662 Pain in left lower leg: Secondary | ICD-10-CM | POA: Insufficient documentation

## 2019-01-03 ENCOUNTER — Encounter: Payer: 59 | Admitting: Family Medicine

## 2019-02-12 ENCOUNTER — Other Ambulatory Visit: Payer: Self-pay | Admitting: Family Medicine

## 2019-02-12 MED FILL — ATORVASTATIN 40 MG TABLET: 40 | 90 days supply | Qty: 90 | Fill #0

## 2019-10-10 LAB — BASIC METABOLIC PANEL
BUN: 22 — AB (ref 4–21)
CO2: 25 — AB (ref 13–22)
Chloride: 101 (ref 99–108)
Creatinine: 1.1 (ref 0.6–1.3)
Glucose: 95
Potassium: 4.2 (ref 3.4–5.3)
Sodium: 140 (ref 137–147)

## 2019-10-10 LAB — CBC AND DIFFERENTIAL
HCT: 41 (ref 41–53)
Hemoglobin: 14.6 (ref 13.5–17.5)
Neutrophils Absolute: 3
Platelets: 267 (ref 150–399)
WBC: 5.8

## 2019-10-10 LAB — HEPATIC FUNCTION PANEL
ALT: 45 — AB (ref 10–40)
AST: 38 (ref 14–40)
Alkaline Phosphatase: 50 (ref 25–125)
Bilirubin, Total: 0.5

## 2019-10-10 LAB — COMPREHENSIVE METABOLIC PANEL
Albumin: 4.6 (ref 3.5–5.0)
Calcium: 9.6 (ref 8.7–10.7)
GFR calc Af Amer: 86
GFR calc non Af Amer: 74
Globulin: 2.5

## 2019-10-10 LAB — LIPID PANEL
Cholesterol: 151 (ref 0–200)
HDL: 41 (ref 35–70)
LDL Cholesterol: 84
Triglycerides: 150 (ref 40–160)

## 2019-10-10 LAB — TSH: TSH: 5.5 (ref 0.41–5.90)

## 2019-10-10 LAB — PSA: PSA: 3.5

## 2019-10-10 LAB — HEMOGLOBIN A1C: Hemoglobin A1C: 5.3

## 2019-10-10 LAB — CBC: RBC: 4.58 (ref 3.87–5.11)

## 2019-10-26 ENCOUNTER — Other Ambulatory Visit: Payer: Self-pay | Admitting: *Deleted

## 2019-10-26 MED ORDER — ZOSTER VAC RECOMB ADJUVANTED 50 MCG/0.5ML IM SUSR: 0.5000 mL | Freq: Once | INTRAMUSCULAR | 1 refills | Status: AC

## 2019-11-08 DIAGNOSIS — H524 Presbyopia: Secondary | ICD-10-CM | POA: Diagnosis not present

## 2019-11-08 DIAGNOSIS — H52223 Regular astigmatism, bilateral: Secondary | ICD-10-CM | POA: Diagnosis not present

## 2019-11-08 DIAGNOSIS — H5213 Myopia, bilateral: Secondary | ICD-10-CM | POA: Diagnosis not present

## 2019-11-20 ENCOUNTER — Other Ambulatory Visit: Payer: Self-pay

## 2019-11-20 ENCOUNTER — Ambulatory Visit (INDEPENDENT_AMBULATORY_CARE_PROVIDER_SITE_OTHER): Payer: 59 | Admitting: Family Medicine

## 2019-11-20 ENCOUNTER — Encounter: Payer: Self-pay | Admitting: Family Medicine

## 2019-11-20 ENCOUNTER — Telehealth: Payer: Self-pay

## 2019-11-20 VITALS — BP 92/59 | HR 68 | Temp 98.0°F | Resp 16 | Ht 74.5 in | Wt 180.8 lb

## 2019-11-20 DIAGNOSIS — E7800 Pure hypercholesterolemia, unspecified: Secondary | ICD-10-CM

## 2019-11-20 DIAGNOSIS — Z125 Encounter for screening for malignant neoplasm of prostate: Secondary | ICD-10-CM

## 2019-11-20 DIAGNOSIS — Z0000 Encounter for general adult medical examination without abnormal findings: Secondary | ICD-10-CM

## 2019-11-20 DIAGNOSIS — R7301 Impaired fasting glucose: Secondary | ICD-10-CM

## 2019-11-20 MED ORDER — SILDENAFIL CITRATE 20 MG PO TABS: ORAL_TABLET | ORAL | 11 refills | Status: DC

## 2019-11-20 MED ORDER — FAMOTIDINE 40 MG PO TABS: 40.0000 mg | ORAL_TABLET | Freq: Every day | ORAL | 3 refills | Status: DC

## 2019-11-20 MED ORDER — ATORVASTATIN CALCIUM 40 MG PO TABS: 40.0000 mg | ORAL_TABLET | Freq: Every day | ORAL | 3 refills | Status: DC

## 2019-11-20 MED ORDER — LORATADINE 10 MG PO TABS: 10.0000 mg | ORAL_TABLET | Freq: Every day | ORAL | 3 refills | Status: DC

## 2019-11-20 MED FILL — SILDENAFIL CITRATE 20 MG TA: 20 | 10 days supply | Qty: 20 | Fill #0

## 2019-11-20 MED FILL — LORATADINE 10 MG TABS: 10 | 90 days supply | Qty: 90 | Fill #0

## 2019-11-20 MED FILL — ATORVASTATIN 40 MG TABLET: 40 | 90 days supply | Qty: 90 | Fill #0

## 2019-11-20 MED FILL — FAMOTIDINE 40 MG TABS: 40 | 90 days supply | Qty: 90 | Fill #0

## 2019-11-20 NOTE — Telephone Encounter (Signed)
LM for pt or pt's wife to return call. Needing to know if all prescriptions sent in today need to be resent or just one.

## 2019-11-20 NOTE — Telephone Encounter (Signed)
Patients wife called and wants to Rx sent to this pharmacy instead of [facility]    [facility] - [place], [place] - [address] [place]

## 2019-11-20 NOTE — Patient Instructions (Signed)

## 2019-11-20 NOTE — Progress Notes (Signed)
Office Note 11/20/2019  CC:  Chief Complaint  Patient presents with  . Annual Exam    pt is not fasting    HPI:  Steve Cunningham is a 64 y.o. White male who is here for annual health maintenance exam. Reviewed Doctor's Day labs from 10/10/19: CBC, CMET, FLP, and HbA1c all normal.  PSA 3.5 compared to 2.49 two years ago.  TSH 5.5 compared to 4.8 about 2 yrs ago.  He is comfortable with PSA where it is, has significantly large prostate, suspects bph the cause of PSA gradually rising/nearing ULN.  Feeling very well.  Retiring in 1 month!  Past Medical History:  Diagnosis Date  . Erectile dysfunction   . GERD (gastroesophageal reflux disease)   . History of adenomatous polyp of colon 2010  . Hyperlipidemia   . IFG (impaired fasting glucose) 12/2017   Gluc 110.  HbA1c 5.4%  . Pain and swelling of left lower leg 10/2018   u/s->complicated baker's cyst vs partial mm tear with hematoma.  . Perennial allergic rhinitis   . Ulnar nerve neuropathy    Bilat, at elbow    Past Surgical History:  Procedure Laterality Date  . COLONOSCOPY  08/08/2008; 08/2015   2010 adenomatous polyp; repeat 08/27/15 no polyps (diverticulosis)--recall 08/2025.  Marland Kitchen LUMBAR DISC SURGERY  approx 2011   L5-S1 (left); Dr. Channing Mutters  . Ulnar neurolysis Left 03/2017   Dr. Lovell Sheehan    Family History  Problem Relation Age of Onset  . Hyperlipidemia Mother   . Hypertension Mother   . Hyperlipidemia Father   . Hypertension Father   . Colon cancer Paternal Grandmother 74    Social History   Socioeconomic History  . Marital status: Married    Spouse name: Not on file  . Number of children: Not on file  . Years of education: Not on file  . Highest education level: Not on file  Occupational History  . Not on file  Tobacco Use  . Smoking status: Never Smoker  . Smokeless tobacco: Never Used  Substance and Sexual Activity  . Alcohol use: Yes    Alcohol/week: 7.0 standard drinks    Types: 7 Standard drinks or  equivalent per week    Comment: 1 glass wine or beer daily  . Drug use: No  . Sexual activity: Not on file  Other Topics Concern  . Not on file  Social History Narrative   Married, 1 son and 1 daughter, both college age.   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   College/med school: [employer].   No T/A/Ds.         Social Determinants of Health   Financial Resource Strain:   . Difficulty of Paying Living Expenses:   Food Insecurity:   . Worried About Programme researcher, broadcasting/film/video in the Last Year:   . Barista in the Last Year:   Transportation Needs:   . Freight forwarder (Medical):   Marland Kitchen Lack of Transportation (Non-Medical):   Physical Activity:   . Days of Exercise per Week:   . Minutes of Exercise per Session:   Stress:   . Feeling of Stress :   Social Connections:   . Frequency of Communication with Friends and Family:   . Frequency of Social Gatherings with Friends and Family:   . Attends Religious Services:   . Active Member of Clubs or Organizations:   . Attends Banker Meetings:   Marland Kitchen Marital Status:  Intimate Partner Violence:   . Fear of Current or Ex-Partner:   . Emotionally Abused:   Marland Kitchen Physically Abused:   . Sexually Abused:     Outpatient Medications Prior to Visit  Medication Sig Dispense Refill  . atorvastatin (LIPITOR) 40 MG tablet Take 1 tablet (40 mg total) by mouth daily. OFFICE VISIT NEEDED 90 tablet 0  . famotidine (PEPCID) 40 MG tablet Take 40 mg by mouth daily. Take 1 tablet daily    . loratadine (CLARITIN) 10 MG tablet Take 1 tablet (10 mg total) by mouth daily. 90 tablet 3  . sildenafil (REVATIO) 20 MG tablet 2 tabs po qd prn 20 tablet 6  . modafinil (PROVIGIL) 200 MG tablet 1 tab po qd prn (Patient not taking: Reported on 11/20/2019) 30 tablet 0   No facility-administered medications prior to visit.    No Known Allergies  Review of Systems  Constitutional: Negative for appetite change,  chills, fatigue and fever.  HENT: Negative for congestion, dental problem, ear pain and sore throat.   Eyes: Negative for discharge, redness and visual disturbance.  Respiratory: Negative for cough, chest tightness, shortness of breath and wheezing.   Cardiovascular: Negative for chest pain, palpitations and leg swelling.  Gastrointestinal: Negative for abdominal pain, blood in stool, diarrhea, nausea and vomiting.  Genitourinary: Negative for difficulty urinating, dysuria, flank pain, frequency, hematuria and urgency.  Musculoskeletal: Negative for arthralgias, back pain, joint swelling, myalgias and neck stiffness.  Skin: Negative for pallor and rash.  Neurological: Negative for dizziness, speech difficulty, weakness and headaches.  Hematological: Negative for adenopathy. Does not bruise/bleed easily.  Psychiatric/Behavioral: Negative for confusion and sleep disturbance. The patient is not nervous/anxious.     PE; Vitals with BMI 11/20/2019 12/28/2017 01/13/2017  Height 6' 2.5" 6' 2.5" 6\' 3"   Weight 180 lbs 13 oz 177 lbs 8 oz 176 lbs 4 oz  BMI 22.91 22.49 22.1  Systolic 92 94 108  Diastolic 59 58 65  Pulse 68 81 69    Gen: Alert, well appearing.  Patient is oriented to person, place, time, and situation. AFFECT: pleasant, lucid thought and speech. ENT: Ears: EACs clear, normal epithelium.  TMs with good light reflex and landmarks bilaterally.  Eyes: no injection, icteris, swelling, or exudate.  EOMI, PERRLA. Nose: no drainage or turbinate edema/swelling.  No injection or focal lesion.  Mouth: lips without lesion/swelling.  Oral mucosa pink and moist.  Dentition intact and without obvious caries or gingival swelling.  Oropharynx without erythema, exudate, or swelling.  Neck: supple/nontender.  No LAD, mass, or TM.  Carotid pulses 2+ bilaterally, without bruits. CV: RRR, no m/r/g.   LUNGS: CTA bilat, nonlabored resps, good aeration in all lung fields. ABD: soft, NT, ND, BS normal.  No  hepatospenomegaly or mass.  No bruits. EXT: no clubbing, cyanosis, or edema.  Musculoskeletal: no joint swelling, erythema, warmth, or tenderness.  ROM of all joints intact. Skin - no sores or suspicious lesions or rashes or color changes Rectal exam: negative without mass, lesions or tenderness, PROSTATE EXAM: smooth and symmetric without nodules or tenderness, enlarged 3-4+.  Pertinent labs:  Lab Results  Component Value Date   TSH 4.18 12/28/2017   T3TOTAL 132 12/28/2017    Lab Results  Component Value Date   WBC 5.0 10/20/2017   HGB 13.8 10/20/2017   HCT 41 10/20/2017   MCV 91.4 06/14/2007   PLT 224 10/20/2017   Lab Results  Component Value Date   CREATININE 1.10 12/28/2017   BUN  20 12/28/2017   NA 140 12/28/2017   K 3.9 12/28/2017   CL 104 12/28/2017   CO2 29 12/28/2017   Lab Results  Component Value Date   ALT 40 10/20/2017   AST 35 10/20/2017   ALKPHOS 39 10/20/2017   BILITOT 0.8 06/14/2007   Lab Results  Component Value Date   CHOL 182 10/20/2017   Lab Results  Component Value Date   HDL 41 10/20/2017   Lab Results  Component Value Date   LDLCALC 111 10/20/2017   Lab Results  Component Value Date   TRIG 151 10/20/2017   No results found for: CHOLHDL Lab Results  Component Value Date   PSA 2.49 10/20/2017   PSA 1.89 10/14/2016   Lab Results  Component Value Date   HGBA1C 5.4 12/30/2017   ASSESSMENT AND PLAN:   Health maintenance exam: Reviewed age and gender appropriate health maintenance issues (prudent diet, regular exercise, health risks of tobacco and excessive alcohol, use of seatbelts, fire alarms in home, use of sunscreen).  Also reviewed age and gender appropriate health screening as well as vaccine recommendations. Vaccines: Tdap UTD.  Shingrix->1st shot 10/27/19, plan #2 in about 2 mo.  He has had 2 covid shots (pfizer). Labs none today. Prostate ca screening: DRE normal except enlargement, PSA normal recently--mild gradual rise over  the years--no w/u at this time.  Continue annual DRE/PSA. Colon ca screening: hx of adenomas, next colonoscopy due 2027.  An After Visit Summary was printed and given to the patient.  FOLLOW UP:  Return in about 1 year (around 11/19/2020) for annual CPE (fasting).  Signed:  Crissie Sickles, MD           11/20/2019

## 2019-11-20 NOTE — Telephone Encounter (Signed)
Patient returned call and all medications resent to [facility] instead.

## 2019-11-22 IMAGING — US RIGHT LOWER EXTREMITY VENOUS ULTRASOUND
1 series · 13 of 24 positions shown · non-contrast
Comparison: None.

CLINICAL DATA: 62-year-old male with right knee pain and swelling
for the past 6 days



[Series 1: right lower extremity venous ultrasound · 49 acquisitions, 13 frames shown]
[im 1/49]
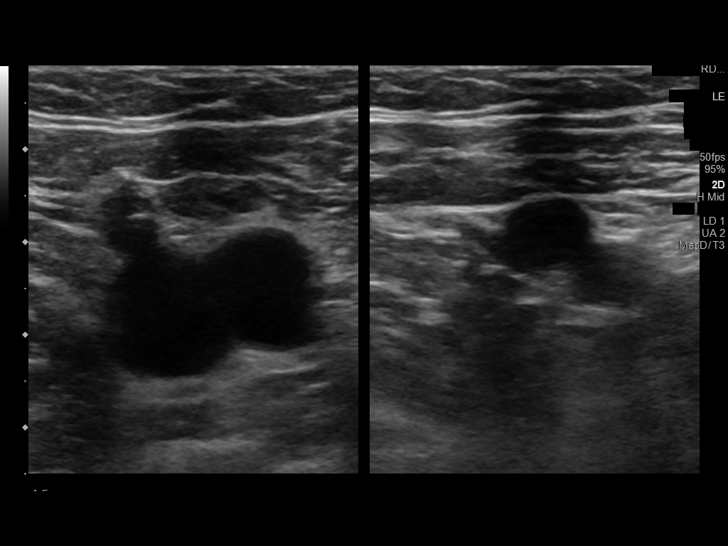
[im 5/49]
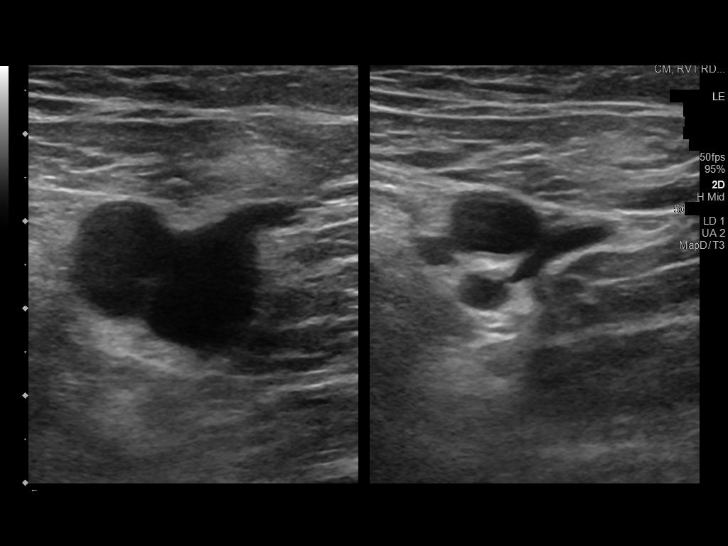
[im 9/49]
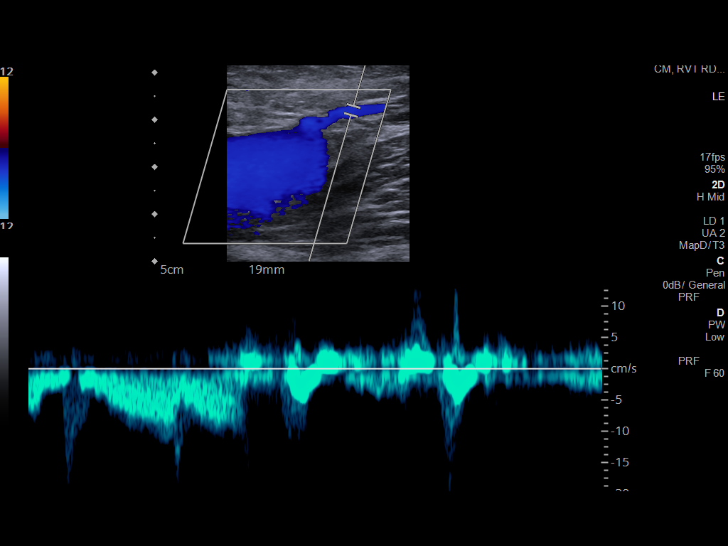
[im 13/49]
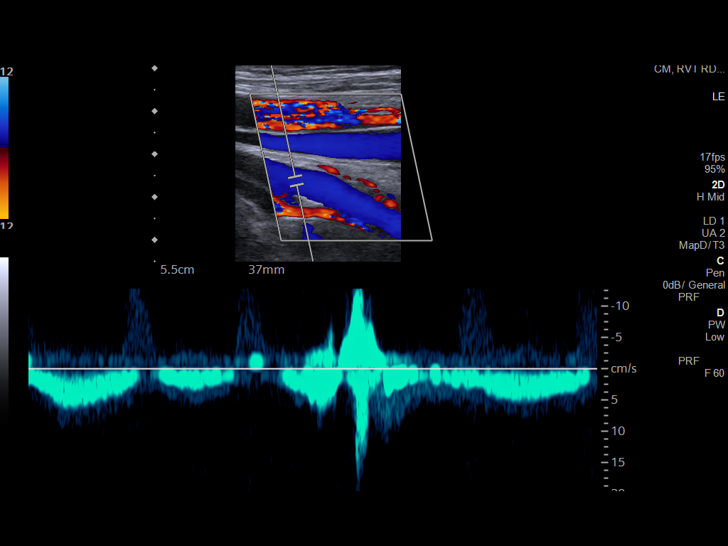
[im 17/49]
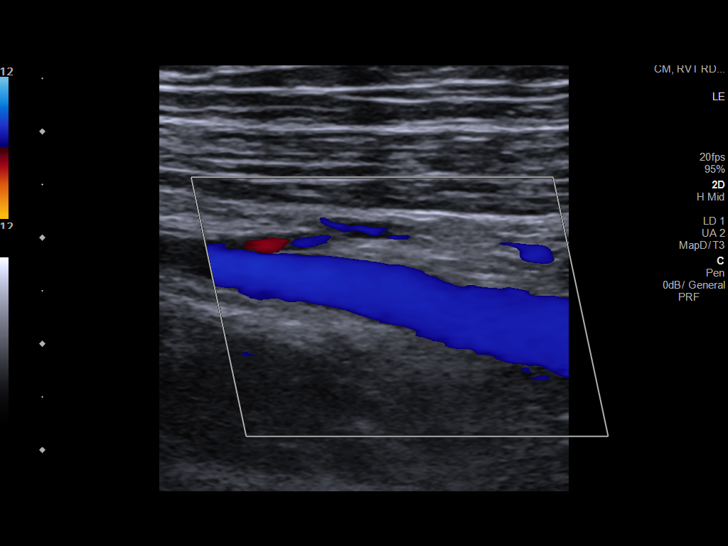
[im 21/49]
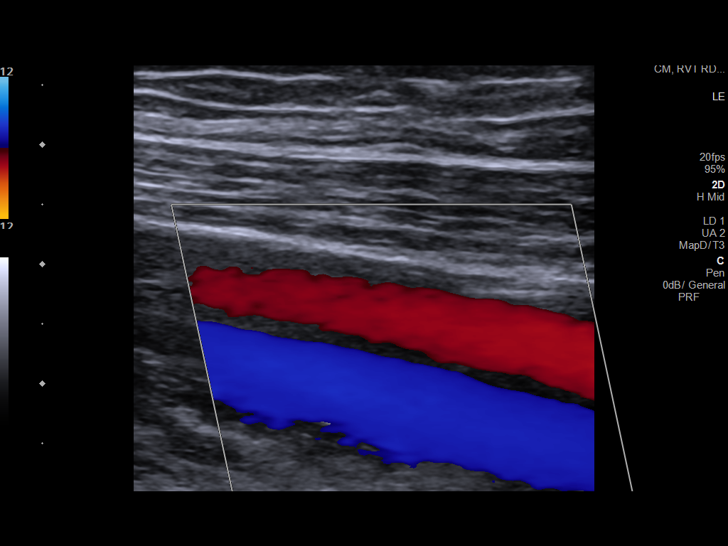
[im 28/49]
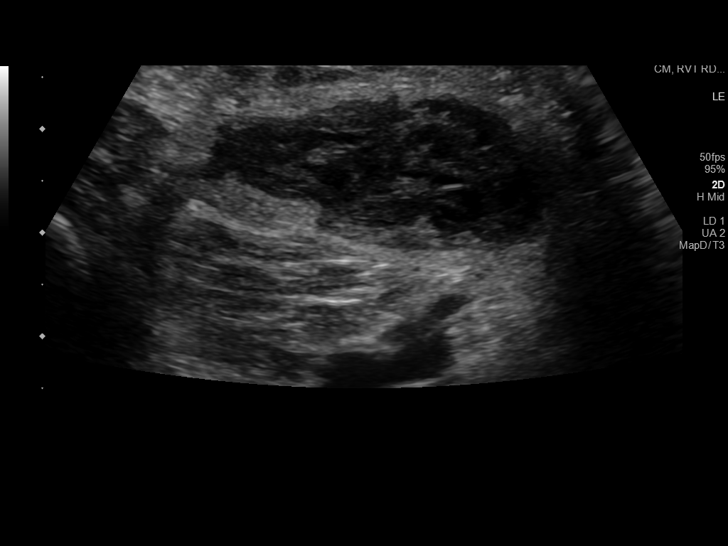
[im 30/49]
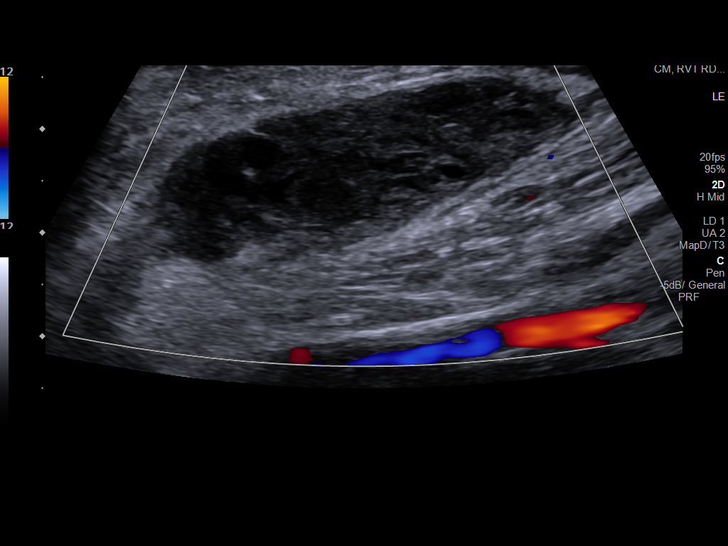
[im 32/49]
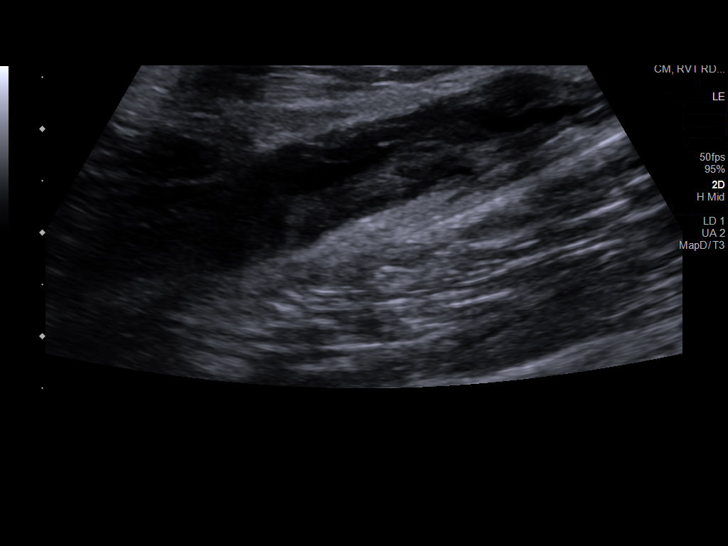
[im 36/49]
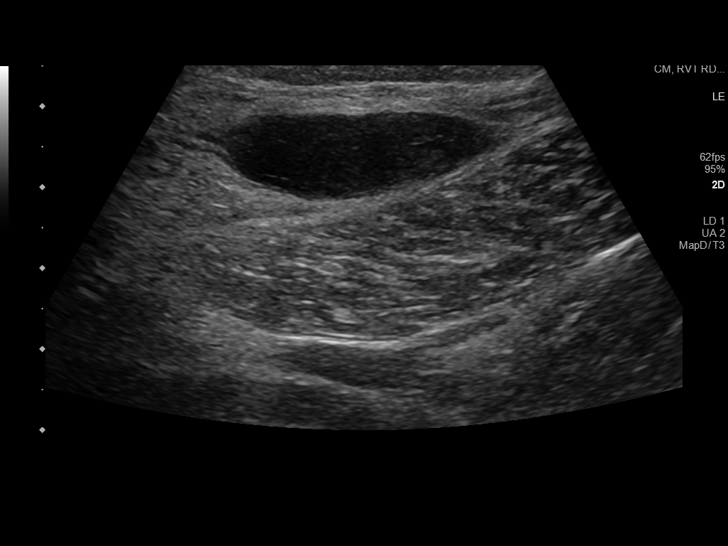
[im 40/49]
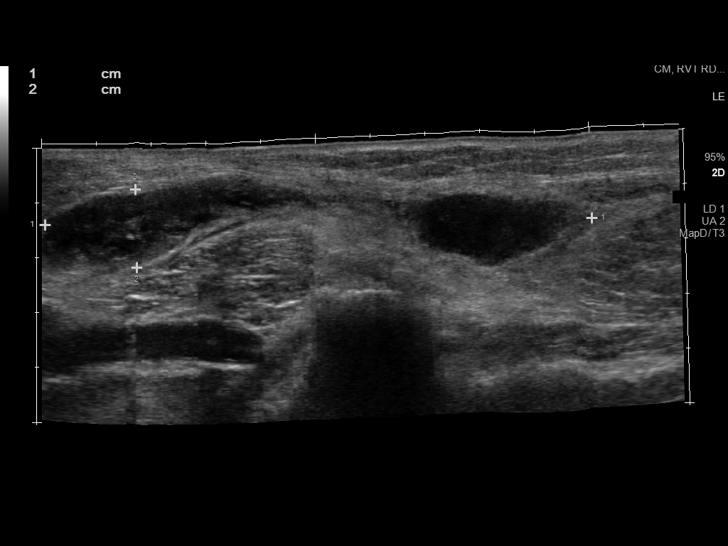
[im 44/49]
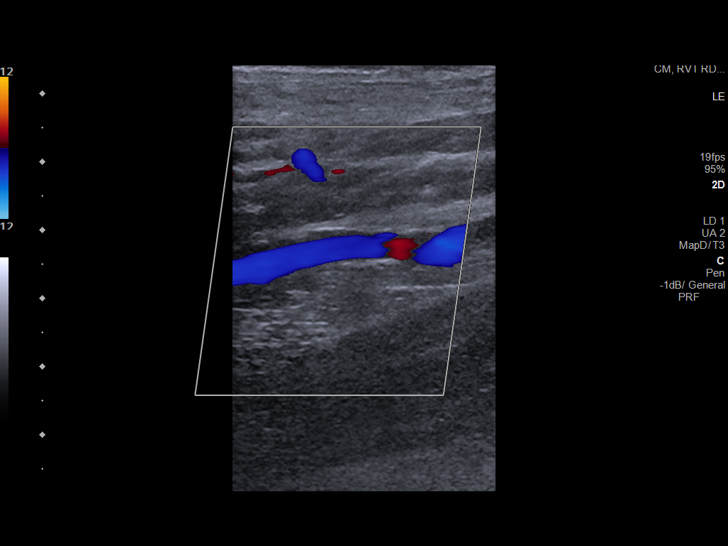
[im 49/49]
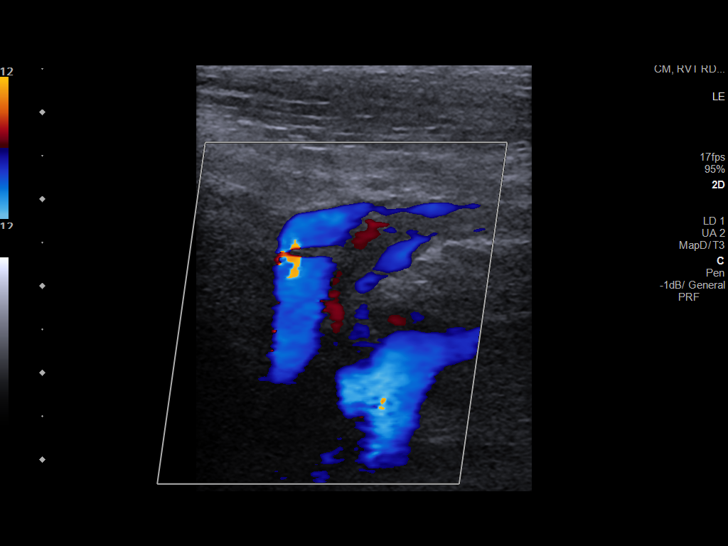

[13 of 24 positions shown; findings below may reference images not displayed]

FINDINGS: Contralateral Common Femoral Vein: Respiratory phasicity is normal
and symmetric with the symptomatic side. No evidence of thrombus.
Normal compressibility.

Common Femoral Vein: No evidence of thrombus. Normal
compressibility, respiratory phasicity and response to augmentation.

Saphenofemoral Junction: No evidence of thrombus. Normal
compressibility and flow on color Doppler imaging.

Profunda Femoral Vein: No evidence of thrombus. Normal
compressibility and flow on color Doppler imaging.

Femoral Vein: No evidence of thrombus. Normal compressibility,
respiratory phasicity and response to augmentation.

Popliteal Vein: No evidence of thrombus. Normal compressibility,
respiratory phasicity and response to augmentation.

Calf Veins: No evidence of thrombus. Normal compressibility and flow
on color Doppler imaging.

Superficial Great Saphenous Vein: No evidence of thrombus. Normal
compressibility.

Venous Reflux:  None.

Other Findings: There is a complex fluid collection in the popliteal
fossa measuring approximately 4.4 x 1.5 by 3.3 cm. This appears to
extend around to the medial aspect of the knee with a second focal
collection measuring approximately 3.6 x 1.1 by 2.0 cm.
IMPRESSION: 1. No evidence of deep venous thrombosis.
2. Complex bilobed fluid collection which begins in the popliteal
fossa and then wraps around to the medial aspect of the knee. This
likely represents a complex, or potentially hemorrhagic Baker's
cyst. Alternately, if the patient suffered a recent injury this
could represent a hematoma. Abscess would also be a consideration in
the appropriate clinical setting although this would be a somewhat
atypical imaging appearance.

## 2019-12-15 MED FILL — SILDENAFIL CITRATE 20 MG TA: 20 | 10 days supply | Qty: 20 | Fill #1

## 2020-11-05 LAB — BASIC METABOLIC PANEL
BUN: 22 — AB (ref 4–21)
CO2: 23 — AB (ref 13–22)
Chloride: 101 (ref 99–108)
Creatinine: 1.1 (ref 0.6–1.3)
Glucose: 102
Potassium: 3.9 (ref 3.4–5.3)
Sodium: 140 (ref 137–147)

## 2020-11-05 LAB — CBC AND DIFFERENTIAL
HCT: 40 — AB (ref 41–53)
Hemoglobin: 13.8 (ref 13.5–17.5)
Neutrophils Absolute: 2.8
Platelets: 267 (ref 150–399)
WBC: 6.1

## 2020-11-05 LAB — CBC: RBC: 4.32 (ref 3.87–5.11)

## 2020-11-05 LAB — TSH: TSH: 3.8 (ref 0.41–5.90)

## 2020-11-05 LAB — LIPID PANEL
Cholesterol: 164 (ref 0–200)
HDL: 40 (ref 35–70)
LDL Cholesterol: 91
Triglycerides: 192 — AB (ref 40–160)

## 2020-11-05 LAB — HEMOGLOBIN A1C: Hemoglobin A1C: 5.4

## 2020-11-05 LAB — COMPREHENSIVE METABOLIC PANEL
Albumin: 4.5 (ref 3.5–5.0)
Calcium: 9.5 (ref 8.7–10.7)
GFR calc non Af Amer: 76
Globulin: 2.5

## 2020-11-05 LAB — HEPATIC FUNCTION PANEL
ALT: 22 (ref 10–40)
AST: 28 (ref 14–40)
Alkaline Phosphatase: 44 (ref 25–125)
Bilirubin, Total: 0.4

## 2021-02-13 ENCOUNTER — Telehealth: Payer: Self-pay

## 2021-02-13 MED ORDER — ATORVASTATIN CALCIUM 40 MG PO TABS: 40.0000 mg | ORAL_TABLET | Freq: Every day | ORAL | 0 refills | Status: DC

## 2021-02-13 NOTE — Telephone Encounter (Signed)
Patient refill request.  Patient has CPE appt 03/17/21 with Dr. Milinda Cave.  Patient request prescription to be changed to 20mg . 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  Patient can be reached at [phone].   New preferred pharmacy, please update to:  [facility], [address]   atorvastatin (LIPITOR) 40 MG tablet 

## 2021-02-13 NOTE — Telephone Encounter (Signed)
Rx sent MUST HAVE OV FOR FURTHER REFILLS  

## 2021-03-17 ENCOUNTER — Encounter: Payer: Self-pay | Admitting: Family Medicine

## 2021-03-17 ENCOUNTER — Other Ambulatory Visit: Payer: Self-pay

## 2021-03-17 ENCOUNTER — Ambulatory Visit (INDEPENDENT_AMBULATORY_CARE_PROVIDER_SITE_OTHER): Payer: 59 | Admitting: Family Medicine

## 2021-03-17 VITALS — BP 96/64 | HR 66 | Temp 97.4°F | Resp 16 | Ht 72.5 in | Wt 178.2 lb

## 2021-03-17 DIAGNOSIS — N138 Other obstructive and reflux uropathy: Secondary | ICD-10-CM

## 2021-03-17 DIAGNOSIS — E7800 Pure hypercholesterolemia, unspecified: Secondary | ICD-10-CM

## 2021-03-17 DIAGNOSIS — Z0000 Encounter for general adult medical examination without abnormal findings: Secondary | ICD-10-CM

## 2021-03-17 DIAGNOSIS — N401 Enlarged prostate with lower urinary tract symptoms: Secondary | ICD-10-CM | POA: Diagnosis not present

## 2021-03-17 MED ORDER — FAMOTIDINE 40 MG PO TABS: 40.0000 mg | ORAL_TABLET | Freq: Every day | ORAL | 3 refills | Status: DC

## 2021-03-17 MED ORDER — ATORVASTATIN CALCIUM 20 MG PO TABS: 20.0000 mg | ORAL_TABLET | Freq: Every day | ORAL | 3 refills | Status: DC

## 2021-03-17 MED ORDER — SILDENAFIL CITRATE 20 MG PO TABS: ORAL_TABLET | ORAL | 11 refills | Status: DC

## 2021-03-17 MED ORDER — LORATADINE 10 MG PO TABS: 10.0000 mg | ORAL_TABLET | Freq: Every day | ORAL | 3 refills | Status: DC

## 2021-03-17 MED ORDER — TAMSULOSIN HCL 0.4 MG PO CAPS: 0.4000 mg | ORAL_CAPSULE | Freq: Every day | ORAL | 3 refills | Status: DC

## 2021-03-17 NOTE — Progress Notes (Signed)
Office Note 03/17/2021  CC:  Chief Complaint  Patient presents with   Annual Exam    Pt is not fasting, had labs completed on 11/05/20    HPI:  Steve Cunningham is a 65 y.o. White male who is here for annual health maintenance exam. A/P as of last visit: "Health maintenance exam: Reviewed age and gender appropriate health maintenance issues (prudent diet, regular exercise, health risks of tobacco and excessive alcohol, use of seatbelts, fire alarms in home, use of sunscreen).  Also reviewed age and gender appropriate health screening as well as vaccine recommendations. Vaccines: Tdap UTD.  Shingrix->1st shot 10/27/19, plan #2 in about 2 mo.  He has had 2 covid shots (pfizer). Labs none today. Prostate ca screening: DRE normal except enlargement, PSA normal recently--mild gradual rise over the years--no w/u at this time.  Continue annual DRE/PSA. Colon ca screening: hx of adenomas, next colonoscopy due 2027."  INTERIM HX: He's doing great, active and healthy as usual.  He cut his atorva to 1/2 of 40mg  tab qd last year and his cholesterol numbers stayed stable. No side effects.  Doctor's Day labs from 11/05/20 reviewed today: all normal except trigs 192.  Other notable results: PSA 3.4 (stable).  Hba1c 5.4%.  He has had some gradually increasing urinary frequency, urgency, and nocturia. Is interested in starting flomax.  Past Medical History:  Diagnosis Date   Erectile dysfunction    GERD (gastroesophageal reflux disease)    History of adenomatous polyp of colon 2010   Hyperlipidemia    IFG (impaired fasting glucose) 12/2017   Gluc 110.  HbA1c 5.4%   Pain and swelling of left lower leg 10/2018   u/s->complicated baker's cyst vs partial mm tear with hematoma.   Perennial allergic rhinitis    Ulnar nerve neuropathy    Bilat, at elbow    Past Surgical History:  Procedure Laterality Date   COLONOSCOPY  08/08/2008; 08/2015   2010 adenomatous polyp; repeat 08/27/15 no polyps  (diverticulosis)--recall 08/2025.   LUMBAR DISC SURGERY  approx 2011   L5-S1 (left); Dr. 2012 neurolysis Left 03/2017   Dr. 04/2017    Family History  Problem Relation Age of Onset   Hyperlipidemia Mother    Hypertension Mother    Hyperlipidemia Father    Hypertension Father    Colon cancer Paternal Grandmother 87    Social History   Socioeconomic History   Marital status: Married    Spouse name: Not on file   Number of children: Not on file   Years of education: Not on file   Highest education level: Not on file  Occupational History   Not on file  Tobacco Use   Smoking status: Never   Smokeless tobacco: Never  Substance and Sexual Activity   Alcohol use: Yes    Alcohol/week: 7.0 standard drinks    Types: 7 Standard drinks or equivalent per week    Comment: 1 glass wine or beer daily   Drug use: No   Sexual activity: Not on file  Other Topics Concern   Not on file  Social History Narrative   Married, 1 son and 1 daughter, both college age.   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   College/med school: [employer].   No T/A/Ds.         Social Determinants of Health   Financial Resource Strain: Not on file  Food Insecurity: Not on file  Transportation Needs: Not on file  Physical  Activity: Not on file  Stress: Not on file  Social Connections: Not on file  Intimate Partner Violence: Not on file    Outpatient Medications Prior to Visit  Medication Sig Dispense Refill   atorvastatin (LIPITOR) 40 MG tablet Take 1 tablet (40 mg total) by mouth daily. OFFICE VISIT NEEDED (Patient taking differently: Take 20 mg by mouth daily. OFFICE VISIT NEEDED) 30 tablet 0   famotidine (PEPCID) 40 MG tablet Take 1 tablet (40 mg total) by mouth daily. Take 1 tablet daily 90 tablet 3   loratadine (CLARITIN) 10 MG tablet Take 1 tablet (10 mg total) by mouth daily. 90 tablet 3   sildenafil (REVATIO) 20 MG tablet 2 tabs po qd prn 20 tablet 11    No facility-administered medications prior to visit.    No Known Allergies  ROS Review of Systems  Constitutional:  Negative for appetite change, chills, fatigue and fever.  HENT:  Negative for congestion, dental problem, ear pain and sore throat.   Eyes:  Negative for discharge, redness and visual disturbance.  Respiratory:  Negative for cough, chest tightness, shortness of breath and wheezing.   Cardiovascular:  Negative for chest pain, palpitations and leg swelling.  Gastrointestinal:  Negative for abdominal pain, blood in stool, diarrhea, nausea and vomiting.  Genitourinary:  Positive for frequency and urgency. Negative for difficulty urinating, dysuria, flank pain and hematuria.  Musculoskeletal:  Negative for arthralgias, back pain, joint swelling, myalgias and neck stiffness.  Skin:  Negative for pallor and rash.  Neurological:  Negative for dizziness, speech difficulty, weakness and headaches.  Hematological:  Negative for adenopathy. Does not bruise/bleed easily.  Psychiatric/Behavioral:  Negative for confusion and sleep disturbance. The patient is not nervous/anxious.    PE; Vitals with BMI 03/17/2021 11/20/2019 12/28/2017  Height 6' 0.5" 6' 2.5" 6' 2.5"  Weight 178 lbs 3 oz 180 lbs 13 oz 177 lbs 8 oz  BMI 23.82 22.91 22.49  Systolic 96 92 94  Diastolic 64 59 58  Pulse 66 68 81   Gen: Alert, well appearing.  Patient is oriented to person, place, time, and situation. AFFECT: pleasant, lucid thought and speech. ENT: Ears: EACs clear, normal epithelium.  TMs with good light reflex and landmarks bilaterally.  Eyes: no injection, icteris, swelling, or exudate.  EOMI, PERRLA. Nose: no drainage or turbinate edema/swelling.  No injection or focal lesion.  Mouth: lips without lesion/swelling.  Oral mucosa pink and moist.  Dentition intact and without obvious caries or gingival swelling.  Oropharynx without erythema, exudate, or swelling.  Neck: supple/nontender.  No LAD, mass, or  TM.  Carotid pulses 2+ bilaterally, without bruits. CV: RRR, no m/r/g.   LUNGS: CTA bilat, nonlabored resps, good aeration in all lung fields. ABD: soft, NT, ND, BS normal.  No hepatospenomegaly or mass.  No bruits. EXT: no clubbing, cyanosis, or edema.  Musculoskeletal: no joint swelling, erythema, warmth, or tenderness.  ROM of all joints intact. Skin - no sores or suspicious lesions or rashes or color changes Rectal exam: negative without mass, lesions or tenderness, PROSTATE EXAM: smooth and symmetric without nodules or tenderness, enlarged 4+.  Pertinent labs:  Lab Results  Component Value Date   TSH 5.50 10/10/2019   Lab Results  Component Value Date   WBC 5.8 10/10/2019   HGB 14.6 10/10/2019   HCT 41 10/10/2019   MCV 91.4 06/14/2007   PLT 267 10/10/2019   Lab Results  Component Value Date   CREATININE 1.1 10/10/2019   BUN 22 (A)  10/10/2019   NA 140 10/10/2019   K 4.2 10/10/2019   CL 101 10/10/2019   CO2 25 (A) 10/10/2019   Lab Results  Component Value Date   ALT 45 (A) 10/10/2019   AST 38 10/10/2019   ALKPHOS 50 10/10/2019   BILITOT 0.8 06/14/2007   Lab Results  Component Value Date   CHOL 151 10/10/2019   Lab Results  Component Value Date   HDL 41 10/10/2019   Lab Results  Component Value Date   LDLCALC 84 10/10/2019   Lab Results  Component Value Date   TRIG 150 10/10/2019   No results found for: [facility] Lab Results  Component Value Date   PSA 3.5 10/10/2019   PSA 2.49 10/20/2017   PSA 1.89 10/14/2016   Lab Results  Component Value Date   HGBA1C 5.3 10/10/2019   ASSESSMENT AND PLAN:   1) BPH with LUT obst sx's: start flomax 0.4mg  qhs, titrate up to 0.8 if needed. Prostate size stable on exam, PSA wnl and stable.  2) HLD: tolerating atorva 20 qd, lipid panel great 11/05/20-->continue.  3) Health maintenance exam: Reviewed age and gender appropriate health maintenance issues (prudent diet, regular exercise, health risks of tobacco and  excessive alcohol, use of seatbelts, fire alarms in home, use of sunscreen).  Also reviewed age and gender appropriate health screening as well as vaccine recommendations. Vaccines: ALL UTD. Labs: all UTD--see hpi. Prostate ca screening: DRE-->marked symmetric hypertrophy unchanged and PSA normal and stable. Colon ca screening: recall 2027.  An After Visit Summary was printed and given to the patient.  FOLLOW UP:  No follow-ups on file.  Signed:  Santiago Bumpers, MD           03/17/2021

## 2021-11-10 LAB — BASIC METABOLIC PANEL
BUN: 19 (ref 4–21)
CO2: 23 — AB (ref 13–22)
Chloride: 102 (ref 99–108)
Creatinine: 1.1 (ref 0.6–1.3)
Glucose: 100
Potassium: 4 mEq/L (ref 3.5–5.1)
Sodium: 140 (ref 137–147)

## 2021-11-10 LAB — LIPID PANEL
Cholesterol: 174 (ref 0–200)
HDL: 39 (ref 35–70)
LDL Cholesterol: 107
Triglycerides: 160 (ref 40–160)

## 2021-11-10 LAB — COMPREHENSIVE METABOLIC PANEL
Albumin: 4.5 (ref 3.5–5.0)
Calcium: 9.1 (ref 8.7–10.7)
Globulin: 2.7
eGFR: 78

## 2021-11-10 LAB — PSA: PSA: 3.9

## 2021-11-10 LAB — HEMOGLOBIN A1C: Hemoglobin A1C: 5.4

## 2021-11-10 LAB — HEPATIC FUNCTION PANEL
ALT: 27 U/L (ref 10–40)
AST: 31 (ref 14–40)
Alkaline Phosphatase: 46 (ref 25–125)
Bilirubin, Total: 0.4

## 2021-11-10 LAB — CBC AND DIFFERENTIAL
HCT: 41 (ref 41–53)
Hemoglobin: 13.7 (ref 13.5–17.5)
Neutrophils Absolute: 2.2
Platelets: 248 10*3/uL (ref 150–400)
WBC: 5.2

## 2021-11-10 LAB — TSH: TSH: 5.74 (ref 0.41–5.90)

## 2021-11-10 LAB — CBC: RBC: 4.53 (ref 3.87–5.11)

## 2022-04-09 ENCOUNTER — Encounter: Payer: Self-pay | Admitting: Family Medicine

## 2022-04-09 ENCOUNTER — Ambulatory Visit (INDEPENDENT_AMBULATORY_CARE_PROVIDER_SITE_OTHER): Payer: 59 | Admitting: Family Medicine

## 2022-04-09 VITALS — BP 101/61 | HR 66 | Temp 97.8°F | Ht 75.5 in | Wt 178.0 lb

## 2022-04-09 DIAGNOSIS — Z125 Encounter for screening for malignant neoplasm of prostate: Secondary | ICD-10-CM

## 2022-04-09 DIAGNOSIS — R7301 Impaired fasting glucose: Secondary | ICD-10-CM | POA: Diagnosis not present

## 2022-04-09 DIAGNOSIS — E7800 Pure hypercholesterolemia, unspecified: Secondary | ICD-10-CM | POA: Diagnosis not present

## 2022-04-09 DIAGNOSIS — Z23 Encounter for immunization: Secondary | ICD-10-CM

## 2022-04-09 DIAGNOSIS — Z0000 Encounter for general adult medical examination without abnormal findings: Secondary | ICD-10-CM

## 2022-04-09 MED ORDER — LORATADINE 10 MG PO TABS: 10.0000 mg | ORAL_TABLET | Freq: Every day | ORAL | 3 refills | Status: DC

## 2022-04-09 MED ORDER — SILDENAFIL CITRATE 20 MG PO TABS: ORAL_TABLET | ORAL | 11 refills | Status: DC

## 2022-04-09 MED ORDER — FAMOTIDINE 40 MG PO TABS: 40.0000 mg | ORAL_TABLET | Freq: Every day | ORAL | 3 refills | Status: DC

## 2022-04-09 MED ORDER — ATORVASTATIN CALCIUM 20 MG PO TABS: 20.0000 mg | ORAL_TABLET | Freq: Every day | ORAL | 3 refills | Status: DC

## 2022-04-09 MED ORDER — TAMSULOSIN HCL 0.4 MG PO CAPS: 0.4000 mg | ORAL_CAPSULE | Freq: Every day | ORAL | 3 refills | Status: DC

## 2022-04-09 NOTE — Patient Instructions (Addendum)
Great to see you, as usual.  Have fun sailing!  Ophthalmologists in the [place] area: [facility] Maintenance, Male Adopting a healthy lifestyle and getting preventive care are important in promoting health and wellness. Ask your health care provider about: The right schedule for you to have regular tests and exams. Things you can do on your own to prevent diseases and keep yourself healthy. What should I know about diet, weight, and exercise? Eat a healthy diet  Eat a diet that includes plenty of vegetables, fruits, low-fat dairy products, and lean protein. Do not eat a lot of foods that are high in solid fats, added sugars, or sodium. Maintain a healthy weight Body mass index (BMI) is a measurement that can be used to identify possible weight problems. It estimates body fat based on height and weight. Your health care provider can help determine your BMI and help you achieve or maintain a healthy weight. Get regular exercise Get regular exercise. This is one of the most important things you can do for your health. Most adults should: Exercise for at least 150 minutes each week. The exercise should increase your heart rate and make you sweat (moderate-intensity exercise). Do strengthening exercises at least twice a week. This is in addition to the moderate-intensity exercise. Spend less time sitting. Even light physical activity can be beneficial. Watch cholesterol and blood lipids Have your blood tested for lipids and cholesterol at 65 years of age, then have this test every 5 years. You may need to have your cholesterol levels checked more often if: Your lipid or cholesterol levels are high. You are older than 65 years of age. You are at high risk for heart disease. What should I know about cancer screening? Many types of cancers can be detected early and may often be prevented. Depending on your health history and family history, you may need to have  cancer screening at various ages. This may include screening for: Colorectal cancer. Prostate cancer. Skin cancer. Lung cancer. What should I know about heart disease, diabetes, and high blood pressure? Blood pressure and heart disease High blood pressure causes heart disease and increases the risk of stroke. This is more likely to develop in people who have high blood pressure readings or are overweight. Talk with your health care provider about your target blood pressure readings. Have your blood pressure checked: Every 3-5 years if you are 43-84 years of age. Every year if you are 65 years old or older. If you are between the ages of 27 and 42 and are a current or former smoker, ask your health care provider if you should have a one-time screening for abdominal aortic aneurysm (AAA). Diabetes Have regular diabetes screenings. This checks your fasting blood sugar level. Have the screening done: Once every three years after age 49 if you are at a normal weight and have a low risk for diabetes. More often and at a younger age if you are overweight or have a high risk for diabetes. What should I know about preventing infection? Hepatitis B If you have a higher risk for hepatitis B, you should be screened for this virus. Talk with your health care provider to find out if you are at risk for hepatitis B infection. Hepatitis C Blood testing is recommended for: Everyone born from [birth year] through 1964. Anyone with known risk factors for hepatitis C. Sexually transmitted infections (STIs) You should be screened each year for STIs, including gonorrhea and chlamydia, if:  You are sexually active and are younger than 65 years of age. You are older than 65 years of age and your health care provider tells you that you are at risk for this type of infection. Your sexual activity has changed since you were last screened, and you are at increased risk for chlamydia or gonorrhea. Ask your health care  provider if you are at risk. Ask your health care provider about whether you are at high risk for HIV. Your health care provider may recommend a prescription medicine to help prevent HIV infection. If you choose to take medicine to prevent HIV, you should first get tested for HIV. You should then be tested every 3 months for as long as you are taking the medicine. Follow these instructions at home: Alcohol use Do not drink alcohol if your health care provider tells you not to drink. If you drink alcohol: Limit how much you have to 0-2 drinks a day. Know how much alcohol is in your drink. In the [place] one drink equals one 12 oz bottle of beer (355 mL), one 5 oz glass of wine (148 mL), or one 1 oz glass of hard liquor (44 mL). Lifestyle Do not use any products that contain nicotine or tobacco. These products include cigarettes, chewing tobacco, and vaping devices, such as e-cigarettes. If you need help quitting, ask your health care provider. Do not use street drugs. Do not share needles. Ask your health care provider for help if you need support or information about quitting drugs. General instructions Schedule regular health, dental, and eye exams. Stay current with your vaccines. Tell your health care provider if: You often feel depressed. You have ever been abused or do not feel safe at home. Summary Adopting a healthy lifestyle and getting preventive care are important in promoting health and wellness. Follow your health care provider's instructions about healthy diet, exercising, and getting tested or screened for diseases. Follow your health care provider's instructions on monitoring your cholesterol and blood pressure. This information is not intended to replace advice given to you by your health care provider. Make sure you discuss any questions you have with your health care provider. Document Revised: 12/01/2020 Document Reviewed: 12/01/2020 Elsevier Patient Education  [place].

## 2022-04-09 NOTE — Progress Notes (Signed)
Office Note 04/09/2022  CC:  Chief Complaint  Patient presents with   Annual Exam   Patient is a 66 y.o. male who is here for annual health maintenance exam. A/P as of last visit: "1) BPH with LUT obst sx's: start flomax 0.4mg  qhs, titrate up to 0.8 if needed. Prostate size stable on exam, PSA wnl and stable.   2) HLD: tolerating atorva 20 qd, lipid panel great 11/05/20-->continue.   3) Health maintenance exam: Reviewed age and gender appropriate health maintenance issues (prudent diet, regular exercise, health risks of tobacco and excessive alcohol, use of seatbelts, fire alarms in home, use of sunscreen).  Also reviewed age and gender appropriate health screening as well as vaccine recommendations. Vaccines: ALL UTD. Labs: all UTD--see hpi. Prostate ca screening: DRE-->marked symmetric hypertrophy unchanged and PSA normal and stable. Colon ca screening: recall 2027."  INTERIM HX: Steve Cunningham is doing very well. Expecting a granddaughter next Feb!  He is active as usual. Says flomax 0.4mg  qd very helpful.  Reviewed his doctors today Labs today and these showed a normal complete metabolic panel, LDL of [id number] and triglycerides of 160, TSH 5.7, PSA 3.9, hemoglobin A1c 5.4%.  Complete blood count normal.  Past Medical History:  Diagnosis Date   BPH with obstruction/lower urinary tract symptoms    Erectile dysfunction    GERD (gastroesophageal reflux disease)    History of adenomatous polyp of colon 2010   Hyperlipidemia    IFG (impaired fasting glucose) 12/2017   Gluc 110.  HbA1c 5.4%   Pain and swelling of left lower leg [id number]   u/s->complicated baker's cyst vs partial mm tear with hematoma.   Perennial allergic rhinitis    Ulnar nerve neuropathy    Bilat, at elbow    Past Surgical History:  Procedure Laterality Date   COLONOSCOPY  08/08/2008; 08/2015   2010 adenomatous polyp; repeat 08/27/15 no polyps (diverticulosis)--recall 08/2025.   LUMBAR DISC SURGERY  approx 2011    L5-S1 (left); Dr. Claris Gladden neurolysis Left 03/2017   Dr. Arnoldo Morale    Family History  Problem Relation Age of Onset   Hyperlipidemia Mother    Hypertension Mother    Hyperlipidemia Father    Hypertension Father    Colon cancer Paternal Grandmother 13    Social History   Socioeconomic History   Marital status: Married    Spouse name: Not on file   Number of children: Not on file   Years of education: Not on file   Highest education level: Not on file  Occupational History   Not on file  Tobacco Use   Smoking status: Never   Smokeless tobacco: Never  Substance and Sexual Activity   Alcohol use: Yes    Alcohol/week: 7.0 standard drinks of alcohol    Types: 7 Standard drinks or equivalent per week    Comment: 1 glass wine or beer daily   Drug use: No   Sexual activity: Not on file  Other Topics Concern   Not on file  Social History Narrative   Married, 1 son and 1 daughter, both college age.   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   College/med school: [employer].   No T/A/Ds.         Social Determinants of Health   Financial Resource Strain: Not on file  Food Insecurity: Not on file  Transportation Needs: Not on file  Physical Activity: Not on file  Stress: Not on file  Social Connections: Not on  file  Intimate Partner Violence: Not on file    Outpatient Medications Prior to Visit  Medication Sig Dispense Refill   atorvastatin (LIPITOR) 20 MG tablet Take 1 tablet (20 mg total) by mouth daily. 90 tablet 3   famotidine (PEPCID) 40 MG tablet Take 1 tablet (40 mg total) by mouth daily. Take 1 tablet daily 90 tablet 3   loratadine (CLARITIN) 10 MG tablet Take 1 tablet (10 mg total) by mouth daily. 90 tablet 3   sildenafil (REVATIO) 20 MG tablet 2 tabs po qd prn 20 tablet 11   tamsulosin (FLOMAX) 0.4 MG CAPS capsule Take 1 capsule (0.4 mg total) by mouth daily. 90 capsule 3   No facility-administered medications prior to visit.     No Known Allergies  ROS Review of Systems  Constitutional:  Negative for appetite change, chills, fatigue and fever.  HENT:  Negative for congestion, dental problem, ear pain and sore throat.   Eyes:  Negative for discharge, redness and visual disturbance.  Respiratory:  Negative for cough, chest tightness, shortness of breath and wheezing.   Cardiovascular:  Negative for chest pain, palpitations and leg swelling.  Gastrointestinal:  Negative for abdominal pain, blood in stool, diarrhea, nausea and vomiting.  Genitourinary:  Negative for difficulty urinating, dysuria, flank pain, frequency, hematuria and urgency.  Musculoskeletal:  Negative for arthralgias, back pain, joint swelling, myalgias and neck stiffness.  Skin:  Negative for pallor and rash.  Neurological:  Negative for dizziness, speech difficulty, weakness and headaches.  Hematological:  Negative for adenopathy. Does not bruise/bleed easily.  Psychiatric/Behavioral:  Negative for confusion and sleep disturbance. The patient is not nervous/anxious.     PE;    04/09/2022    9:36 AM 03/17/2021    9:43 AM 11/20/2019    9:52 AM  Vitals with BMI  Height 6' 3.5" 6' 0.5" 6' 2.5"  Weight 178 lbs 178 lbs 3 oz 180 lbs 13 oz  BMI 21.95 23.82 22.91  Systolic 101 96 92  Diastolic 61 64 59  Pulse 66 66 68    Gen: Alert, well appearing.  Patient is oriented to person, place, time, and situation. AFFECT: pleasant, lucid thought and speech. ENT: Ears: EACs clear, normal epithelium.  TMs with good light reflex and landmarks bilaterally.  Eyes: no injection, icteris, swelling, or exudate.  EOMI, PERRLA. Nose: no drainage or turbinate edema/swelling.  No injection or focal lesion.  Mouth: lips without lesion/swelling.  Oral mucosa pink and moist.  Dentition intact and without obvious caries or gingival swelling.  Oropharynx without erythema, exudate, or swelling.  Neck: supple/nontender.  No LAD, mass, or TM.  Carotid pulses 2+  bilaterally, without bruits. CV: RRR, no m/r/g.   LUNGS: CTA bilat, nonlabored resps, good aeration in all lung fields. ABD: soft, NT, ND, BS normal.  No hepatospenomegaly or mass.  No bruits. EXT: no clubbing, cyanosis, or edema.  Musculoskeletal: no joint swelling, erythema, warmth, or tenderness.  ROM of all joints intact. Skin - no sores or suspicious lesions or rashes or color changes  Pertinent labs:  Lab Results  Component Value Date   TSH 3.80 11/05/2020   Lab Results  Component Value Date   WBC 6.1 11/05/2020   HGB 13.8 11/05/2020   HCT 40 (A) 11/05/2020   MCV 91.4 06/14/2007   PLT 267 11/05/2020   Lab Results  Component Value Date   CREATININE 1.1 11/05/2020   BUN 22 (A) 11/05/2020   NA 140 11/05/2020   K 3.9 11/05/2020  CL 101 11/05/2020   CO2 23 (A) 11/05/2020   Lab Results  Component Value Date   ALT 22 11/05/2020   AST 28 11/05/2020   ALKPHOS 44 11/05/2020   BILITOT 0.8 06/14/2007   Lab Results  Component Value Date   CHOL 164 11/05/2020   Lab Results  Component Value Date   HDL 40 11/05/2020   Lab Results  Component Value Date   LDLCALC 91 11/05/2020   Lab Results  Component Value Date   TRIG 192 (A) 11/05/2020   No results found for: "CHOLHDL" Lab Results  Component Value Date   PSA 3.5 10/10/2019   PSA 2.49 10/20/2017   PSA 1.89 10/14/2016   Lab Results  Component Value Date   HGBA1C 5.4 11/05/2020   ASSESSMENT AND PLAN:   #1 right eye cataract.  He requested the names of a few ophthalmologists in the [place] area that could take care of this for him.  #2 BPH with LUTS. Has had very good response to Flomax 0.4 mg nightly.  Refilled.  3 hypercholesterolemia.  LDL stable at 107. Continue atorvastatin 20 mg a day.  4. Health maintenance exam: Reviewed age and gender appropriate health maintenance issues (prudent diet, regular exercise, health risks of tobacco and excessive alcohol, use of seatbelts, fire alarms in home, use  of sunscreen).  Also reviewed age and gender appropriate health screening as well as vaccine recommendations. Vaccines: Flu->given today.  UTD. Labs: fasting HP, PSA Prostate ca screening: PSA today Colon ca screening: recall 2027  An After Visit Summary was printed and given to the patient.  FOLLOW UP:  Return in about 1 year (around 04/10/2023) for annual CPE (fasting).  Signed:  Santiago Bumpers, MD           04/09/2022

## 2022-04-21 DIAGNOSIS — Z23 Encounter for immunization: Secondary | ICD-10-CM | POA: Diagnosis not present

## 2023-02-01 DIAGNOSIS — D1801 Hemangioma of skin and subcutaneous tissue: Secondary | ICD-10-CM | POA: Diagnosis not present

## 2023-02-01 DIAGNOSIS — C44319 Basal cell carcinoma of skin of other parts of face: Secondary | ICD-10-CM | POA: Diagnosis not present

## 2023-02-01 DIAGNOSIS — L814 Other melanin hyperpigmentation: Secondary | ICD-10-CM | POA: Diagnosis not present

## 2023-02-01 DIAGNOSIS — L280 Lichen simplex chronicus: Secondary | ICD-10-CM | POA: Diagnosis not present

## 2023-02-01 DIAGNOSIS — L821 Other seborrheic keratosis: Secondary | ICD-10-CM | POA: Diagnosis not present

## 2023-02-01 DIAGNOSIS — Z85828 Personal history of other malignant neoplasm of skin: Secondary | ICD-10-CM | POA: Diagnosis not present

## 2023-02-01 DIAGNOSIS — L570 Actinic keratosis: Secondary | ICD-10-CM | POA: Diagnosis not present

## 2023-02-10 ENCOUNTER — Telehealth: Payer: Self-pay

## 2023-02-10 DIAGNOSIS — Z125 Encounter for screening for malignant neoplasm of prostate: Secondary | ICD-10-CM

## 2023-02-10 DIAGNOSIS — R7301 Impaired fasting glucose: Secondary | ICD-10-CM

## 2023-02-10 DIAGNOSIS — E7800 Pure hypercholesterolemia, unspecified: Secondary | ICD-10-CM

## 2023-02-10 DIAGNOSIS — Z0000 Encounter for general adult medical examination without abnormal findings: Secondary | ICD-10-CM

## 2023-02-10 NOTE — Telephone Encounter (Signed)
Patient scheduled CPE for 04/15/23.  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  Patient would like complete blood panel (including PSA) order sent to [facility] in [place]. Fax # [phone]

## 2023-02-11 NOTE — Telephone Encounter (Signed)
Ok, done 

## 2023-02-11 NOTE — Telephone Encounter (Signed)
Please confirm and sign labs, CMA will fax once complete.

## 2023-03-09 DIAGNOSIS — C44319 Basal cell carcinoma of skin of other parts of face: Secondary | ICD-10-CM | POA: Diagnosis not present

## 2023-03-09 DIAGNOSIS — Z85828 Personal history of other malignant neoplasm of skin: Secondary | ICD-10-CM | POA: Diagnosis not present

## 2023-03-10 ENCOUNTER — Encounter (INDEPENDENT_AMBULATORY_CARE_PROVIDER_SITE_OTHER): Payer: Self-pay

## 2023-03-17 ENCOUNTER — Other Ambulatory Visit: Payer: Self-pay | Admitting: Family Medicine

## 2023-04-13 ENCOUNTER — Telehealth: Payer: Self-pay | Admitting: Family Medicine

## 2023-04-13 ENCOUNTER — Other Ambulatory Visit: Payer: Self-pay | Admitting: Nurse Practitioner

## 2023-04-13 DIAGNOSIS — Z130 Encounter for screening for diseases of the blood and blood-forming organs and certain disorders involving the immune mechanism: Secondary | ICD-10-CM | POA: Diagnosis not present

## 2023-04-13 DIAGNOSIS — Z13228 Encounter for screening for other metabolic disorders: Secondary | ICD-10-CM | POA: Diagnosis not present

## 2023-04-13 DIAGNOSIS — Z125 Encounter for screening for malignant neoplasm of prostate: Secondary | ICD-10-CM

## 2023-04-13 DIAGNOSIS — E785 Hyperlipidemia, unspecified: Secondary | ICD-10-CM | POA: Diagnosis not present

## 2023-04-13 DIAGNOSIS — Z1329 Encounter for screening for other suspected endocrine disorder: Secondary | ICD-10-CM | POA: Diagnosis not present

## 2023-04-13 DIAGNOSIS — Z0000 Encounter for general adult medical examination without abnormal findings: Secondary | ICD-10-CM

## 2023-04-13 NOTE — Telephone Encounter (Signed)
LM for pt to return call to discuss.  Appears that labs should have been faxed back in July to the number provided. Unsure of who the ordering physician is that placed orders today.

## 2023-04-13 NOTE — Telephone Encounter (Signed)
Patient called to state he went to [facility] this morning, and had his labs drawn for his upcoming appt with Dr. Milinda Cave. They proceeded with the labs, however no orders were placed so orders are needed. He also would like a call back to discuss. His number is [phone]

## 2023-04-14 LAB — CBC WITH DIFFERENTIAL/PLATELET
Basophils Absolute: 0.1 10*3/uL (ref 0.0–0.2)
Basos: 1 %
EOS (ABSOLUTE): 0.1 10*3/uL (ref 0.0–0.4)
Eos: 2 %
Hematocrit: 42.1 % (ref 37.5–51.0)
Hemoglobin: 14.1 g/dL (ref 13.0–17.7)
Immature Grans (Abs): 0 10*3/uL (ref 0.0–0.1)
Immature Granulocytes: 0 %
Lymphocytes Absolute: 2.9 10*3/uL (ref 0.7–3.1)
Lymphs: 47 %
MCH: 31.5 pg (ref 26.6–33.0)
MCHC: 33.5 g/dL (ref 31.5–35.7)
MCV: 94 fL (ref 79–97)
Monocytes Absolute: 0.4 10*3/uL (ref 0.1–0.9)
Monocytes: 6 %
Neutrophils Absolute: 2.6 10*3/uL (ref 1.4–7.0)
Neutrophils: 44 %
Platelets: 251 10*3/uL (ref 150–450)
RBC: 4.48 x10E6/uL (ref 4.14–5.80)
RDW: 11.9 % (ref 11.6–15.4)
WBC: 6 10*3/uL (ref 3.4–10.8)

## 2023-04-14 LAB — T4, FREE: Free T4: 1.36 ng/dL (ref 0.82–1.77)

## 2023-04-14 LAB — COMPREHENSIVE METABOLIC PANEL
ALT: 26 IU/L (ref 0–44)
AST: 22 IU/L (ref 0–40)
Albumin: 4.4 g/dL (ref 3.9–4.9)
Alkaline Phosphatase: 43 IU/L — ABNORMAL LOW (ref 44–121)
BUN/Creatinine Ratio: 16 (ref 10–24)
BUN: 19 mg/dL (ref 8–27)
Bilirubin Total: 0.6 mg/dL (ref 0.0–1.2)
CO2: 25 mmol/L (ref 20–29)
Calcium: 9.6 mg/dL (ref 8.6–10.2)
Chloride: 101 mmol/L (ref 96–106)
Creatinine, Ser: 1.17 mg/dL (ref 0.76–1.27)
Globulin, Total: 2.8 g/dL (ref 1.5–4.5)
Glucose: 122 mg/dL — ABNORMAL HIGH (ref 70–99)
Potassium: 4.4 mmol/L (ref 3.5–5.2)
Sodium: 141 mmol/L (ref 134–144)
Total Protein: 7.2 g/dL (ref 6.0–8.5)
eGFR: 69 mL/min/{1.73_m2} (ref >59)

## 2023-04-14 LAB — LIPID PANEL
Chol/HDL Ratio: 3.5 ratio (ref 0.0–5.0)
Cholesterol, Total: 159 mg/dL (ref 100–199)
HDL: 45 mg/dL (ref >39)
LDL Chol Calc (NIH): 90 mg/dL (ref 0–99)
Triglycerides: 134 mg/dL (ref 0–149)
VLDL Cholesterol Cal: 24 mg/dL (ref 5–40)

## 2023-04-14 LAB — PSA: Prostate Specific Ag, Serum: 5 ng/mL — ABNORMAL HIGH (ref 0.0–4.0)

## 2023-04-14 LAB — TSH: TSH: 2.97 u[IU]/mL (ref 0.450–4.500)

## 2023-04-14 NOTE — Patient Instructions (Signed)

## 2023-04-15 ENCOUNTER — Encounter: Payer: Self-pay | Admitting: Family Medicine

## 2023-04-15 ENCOUNTER — Ambulatory Visit (INDEPENDENT_AMBULATORY_CARE_PROVIDER_SITE_OTHER): Payer: PPO | Admitting: Family Medicine

## 2023-04-15 VITALS — BP 98/56 | HR 77 | Temp 98.0°F | Ht 75.5 in | Wt 181.4 lb

## 2023-04-15 DIAGNOSIS — R972 Elevated prostate specific antigen [PSA]: Secondary | ICD-10-CM | POA: Diagnosis not present

## 2023-04-15 DIAGNOSIS — Z0000 Encounter for general adult medical examination without abnormal findings: Secondary | ICD-10-CM

## 2023-04-15 DIAGNOSIS — R7303 Prediabetes: Secondary | ICD-10-CM | POA: Diagnosis not present

## 2023-04-15 DIAGNOSIS — N401 Enlarged prostate with lower urinary tract symptoms: Secondary | ICD-10-CM | POA: Diagnosis not present

## 2023-04-15 DIAGNOSIS — H269 Unspecified cataract: Secondary | ICD-10-CM

## 2023-04-15 DIAGNOSIS — Z23 Encounter for immunization: Secondary | ICD-10-CM | POA: Diagnosis not present

## 2023-04-15 DIAGNOSIS — N138 Other obstructive and reflux uropathy: Secondary | ICD-10-CM

## 2023-04-15 DIAGNOSIS — N400 Enlarged prostate without lower urinary tract symptoms: Secondary | ICD-10-CM | POA: Diagnosis not present

## 2023-04-15 LAB — POCT GLYCOSYLATED HEMOGLOBIN (HGB A1C)
HbA1c POC (<> result, manual entry): 5.2 % (ref 4.0–5.6)
HbA1c, POC (controlled diabetic range): 5.2 % (ref 0.0–7.0)
HbA1c, POC (prediabetic range): 5.2 % — AB (ref 5.7–6.4)
Hemoglobin A1C: 5.2 % (ref 4.0–5.6)

## 2023-04-15 MED ORDER — TAMSULOSIN HCL 0.4 MG PO CAPS: 0.4000 mg | ORAL_CAPSULE | Freq: Every day | ORAL | 3 refills | Status: DC

## 2023-04-15 MED ORDER — SILDENAFIL CITRATE 20 MG PO TABS: ORAL_TABLET | ORAL | 11 refills | Status: DC

## 2023-04-15 MED ORDER — FAMOTIDINE 40 MG PO TABS: 40.0000 mg | ORAL_TABLET | Freq: Every day | ORAL | 3 refills | Status: DC

## 2023-04-15 MED ORDER — ATORVASTATIN CALCIUM 20 MG PO TABS: 20.0000 mg | ORAL_TABLET | Freq: Every day | ORAL | 3 refills | Status: DC

## 2023-04-15 MED ORDER — LORATADINE 10 MG PO TABS: 10.0000 mg | ORAL_TABLET | Freq: Every day | ORAL | 3 refills | Status: DC

## 2023-04-15 NOTE — Addendum Note (Signed)
Addended by: Jeoffrey Massed on: 04/15/2023 09:10 AM   Modules accepted: Orders

## 2023-04-15 NOTE — Telephone Encounter (Signed)
Pt was seen for appt today, he confirmed nothing additional was needed.

## 2023-04-15 NOTE — Progress Notes (Signed)
Office Note 04/15/2023  CC:  Chief Complaint  Patient presents with   Annual Exam    Pt is not fasting   Patient is a 67 y.o. male who is here for annual health maintenance exam. I last saw him 1 yr ago. A/P as of that visit: "#1 right eye cataract.  He requested the names of a few ophthalmologists in the [place] area that could take care of this for him.   #2 BPH with LUTS. Has had very good response to Flomax 0.4 mg nightly.  Refilled.   3 hypercholesterolemia.  LDL stable at 107. Continue atorvastatin 20 mg a day."  INTERIM HX: Steve Cunningham feels well. Family is doing well.  He has a new granddaughter. 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 He does daily light weight training and walking.  No significant cardio in the last 6 months.   Labs 04/13/2023 reviewed.  All normal except glucose 122, PSA 5.0  Past Medical History:  Diagnosis Date   BPH with obstruction/lower urinary tract symptoms    Erectile dysfunction    GERD (gastroesophageal reflux disease)    History of adenomatous polyp of colon 2010   Hyperlipidemia    IFG (impaired fasting glucose) 12/2017   Gluc 110.  HbA1c 5.4%   Pain and swelling of left lower leg 10/2018   u/s->complicated baker's cyst vs partial mm tear with hematoma.   Perennial allergic rhinitis    Ulnar nerve neuropathy    Bilat, at elbow    Past Surgical History:  Procedure Laterality Date   COLONOSCOPY  08/08/2008; 08/2015   2010 adenomatous polyp; repeat 08/27/15 no polyps (diverticulosis)--recall 08/2025.   LUMBAR DISC SURGERY  approx 2011   L5-S1 (left); Dr. Dorothy Puffer neurolysis Left 03/2017   Dr. Lovell Sheehan    Family History  Problem Relation Age of Onset   Hyperlipidemia Mother    Hypertension Mother    Hyperlipidemia Father    Hypertension Father    Colon cancer Paternal Grandmother 23    Social History   Socioeconomic History   Marital status: Married    Spouse name: Not on file   Number of  children: Not on file   Years of education: Not on file   Highest education level: Not on file  Occupational History   Not on file  Tobacco Use   Smoking status: Never   Smokeless tobacco: Never  Substance and Sexual Activity   Alcohol use: Yes    Alcohol/week: 7.0 standard drinks of alcohol    Types: 7 Standard drinks or equivalent per week    Comment: 1 glass wine or beer daily   Drug use: No   Sexual activity: Not on file  Other Topics Concern   Not on file  Social History Narrative   Married, 1 son and 1 daughter, both college age.   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   College/med school: [employer].   No T/A/Ds.         Social Determinants of Health   Financial Resource Strain: Not on file  Food Insecurity: Not on file  Transportation Needs: Not on file  Physical Activity: Not on file  Stress: Not on file  Social Connections: Not on file  Intimate Partner Violence: Not on file    Outpatient Medications Prior to Visit  Medication Sig Dispense Refill   atorvastatin (LIPITOR) 20 MG tablet Take 1 tablet (20 mg total) by mouth daily. 90 tablet 3  famotidine (PEPCID) 40 MG tablet Take 1 tablet (40 mg total) by mouth daily. Take 1 tablet daily 90 tablet 3   loratadine (CLARITIN) 10 MG tablet Take 1 tablet (10 mg total) by mouth daily. 90 tablet 3   sildenafil (REVATIO) 20 MG tablet 2 tabs po qd prn 20 tablet 11   tamsulosin (FLOMAX) 0.4 MG CAPS capsule Take 1 capsule by mouth once daily 30 capsule 0   No facility-administered medications prior to visit.    No Known Allergies  Review of Systems  Constitutional:  Negative for appetite change, chills, fatigue and fever.  HENT:  Negative for congestion, dental problem, ear pain and sore throat.   Eyes:  Negative for discharge, redness and visual disturbance.  Respiratory:  Negative for cough, chest tightness, shortness of breath and wheezing.   Cardiovascular:  Negative for chest  pain, palpitations and leg swelling.  Gastrointestinal:  Negative for abdominal pain, blood in stool, diarrhea, nausea and vomiting.  Genitourinary:  Negative for difficulty urinating, dysuria, flank pain, frequency, hematuria and urgency.  Musculoskeletal:  Negative for arthralgias, back pain, joint swelling, myalgias and neck stiffness.  Skin:  Negative for pallor and rash.  Neurological:  Negative for dizziness, speech difficulty, weakness and headaches.  Hematological:  Negative for adenopathy. Does not bruise/bleed easily.  Psychiatric/Behavioral:  Negative for confusion and sleep disturbance. The patient is not nervous/anxious.     PE;    04/15/2023    8:29 AM 04/09/2022    9:36 AM 03/17/2021    9:43 AM  Vitals with BMI  Height 6' 3.5" 6' 3.5" 6' 0.5"  Weight 181 lbs 6 oz 178 lbs 178 lbs 3 oz  BMI 22.37 21.95 23.82  Systolic 98 101 96  Diastolic 56 61 64  Pulse 77 66 66    Gen: Alert, well appearing.  Patient is oriented to person, place, time, and situation. AFFECT: pleasant, lucid thought and speech. ENT: Ears: EACs clear, normal epithelium.  TMs with good light reflex and landmarks bilaterally.  Eyes: no injection, icteris, swelling, or exudate.  EOMI, PERRLA. Nose: no drainage or turbinate edema/swelling.  No injection or focal lesion.  Mouth: lips without lesion/swelling.  Oral mucosa pink and moist.  Dentition intact and without obvious caries or gingival swelling.  Oropharynx without erythema, exudate, or swelling.  Neck: supple/nontender.  No LAD, mass, or TM.  Carotid pulses 2+ bilaterally, without bruits. CV: RRR, no m/r/g.   LUNGS: CTA bilat, nonlabored resps, good aeration in all lung fields. ABD: soft, NT, ND, BS normal.  No hepatospenomegaly or mass.  No bruits. EXT: no clubbing, cyanosis, or edema.  Musculoskeletal: no joint swelling, erythema, warmth, or tenderness.  ROM of all joints intact. Skin - no sores or suspicious lesions or rashes or color  changes  Pertinent labs:  Lab Results  Component Value Date   TSH 2.970 04/13/2023   Lab Results  Component Value Date   WBC 6.0 04/13/2023   HGB 14.1 04/13/2023   HCT 42.1 04/13/2023   MCV 94 04/13/2023   PLT 251 04/13/2023   Lab Results  Component Value Date   CREATININE 1.17 04/13/2023   BUN 19 04/13/2023   NA 141 04/13/2023   K 4.4 04/13/2023   CL 101 04/13/2023   CO2 25 04/13/2023   Lab Results  Component Value Date   ALT 26 04/13/2023   AST 22 04/13/2023   ALKPHOS 43 (L) 04/13/2023   BILITOT 0.6 04/13/2023   Lab Results  Component Value Date  CHOL 159 04/13/2023   Lab Results  Component Value Date   HDL 45 04/13/2023   Lab Results  Component Value Date   LDLCALC 90 04/13/2023   Lab Results  Component Value Date   TRIG 134 04/13/2023   Lab Results  Component Value Date   CHOLHDL 3.5 04/13/2023   Lab Results  Component Value Date   PSA 3.9 11/10/2021   PSA 3.5 10/10/2019   PSA 2.49 10/20/2017    Lab Results  Component Value Date   HGBA1C 5.2 04/15/2023   HGBA1C 5.2 04/15/2023   HGBA1C 5.2 (A) 04/15/2023   HGBA1C 5.2 04/15/2023   ASSESSMENT AND PLAN:   #1 health maintenance exam: Reviewed age and gender appropriate health maintenance issues (prudent diet, regular exercise, health risks of tobacco and excessive alcohol, use of seatbelts, fire alarms in home, use of sunscreen).  Also reviewed age and gender appropriate health screening as well as vaccine recommendations. Vaccines: Flu->given today.  Prevnar 20--> he will get this at the pharmacy. Labs: Health panel labs and PSA have been reviewed with patient today. Prostate ca screening: PSA is 5.0. (Compared to 3.9 in 2023 and 3.5 in 2021.) Colon ca screening: recall 2027  #2 mild hyperglycemia. Glucose on recent labs was elevated but he states he was not fasting. POC Hba1c today is 5.2%.  #3 elevated PSA. He does have significantly large prostate on past exams.  No history of  nodules. Referred to urology today.  #4 right eye cataract. Referred to ophthalmology today.  FOLLOW UP:  Return in about 1 year (around 04/14/2024) for annual CPE (fasting).  Signed:  Santiago Bumpers, MD           04/15/2023

## 2023-06-20 DIAGNOSIS — H2513 Age-related nuclear cataract, bilateral: Secondary | ICD-10-CM | POA: Diagnosis not present

## 2023-06-20 DIAGNOSIS — H5213 Myopia, bilateral: Secondary | ICD-10-CM | POA: Diagnosis not present

## 2023-06-21 DIAGNOSIS — R350 Frequency of micturition: Secondary | ICD-10-CM | POA: Diagnosis not present

## 2023-06-21 DIAGNOSIS — N401 Enlarged prostate with lower urinary tract symptoms: Secondary | ICD-10-CM | POA: Diagnosis not present

## 2023-06-21 DIAGNOSIS — R972 Elevated prostate specific antigen [PSA]: Secondary | ICD-10-CM | POA: Diagnosis not present

## 2023-06-28 ENCOUNTER — Other Ambulatory Visit: Payer: Self-pay | Admitting: Urology

## 2023-06-28 DIAGNOSIS — R972 Elevated prostate specific antigen [PSA]: Secondary | ICD-10-CM

## 2023-08-17 ENCOUNTER — Ambulatory Visit
Admission: RE | Admit: 2023-08-17 | Discharge: 2023-08-17 | Disposition: A | Discharge status: Home / Self Care | Payer: PPO | Source: Ambulatory Visit | Attending: Urology

## 2023-08-17 DIAGNOSIS — R972 Elevated prostate specific antigen [PSA]: Secondary | ICD-10-CM

## 2023-08-17 MED ORDER — GADOPICLENOL 0.5 MMOL/ML IV SOLN
8.0000 mL | Freq: Once | INTRAVENOUS | Status: AC | PRN
  Administered 2023-08-17: 8 mL via INTRAVENOUS

## 2023-08-30 DIAGNOSIS — H25811 Combined forms of age-related cataract, right eye: Secondary | ICD-10-CM | POA: Diagnosis not present

## 2023-08-30 DIAGNOSIS — Z961 Presence of intraocular lens: Secondary | ICD-10-CM | POA: Diagnosis not present

## 2023-08-30 DIAGNOSIS — H2511 Age-related nuclear cataract, right eye: Secondary | ICD-10-CM | POA: Diagnosis not present

## 2024-02-07 DIAGNOSIS — R972 Elevated prostate specific antigen [PSA]: Secondary | ICD-10-CM | POA: Diagnosis not present

## 2024-02-07 DIAGNOSIS — R350 Frequency of micturition: Secondary | ICD-10-CM | POA: Diagnosis not present

## 2024-02-07 DIAGNOSIS — N401 Enlarged prostate with lower urinary tract symptoms: Secondary | ICD-10-CM | POA: Diagnosis not present

## 2024-03-26 ENCOUNTER — Other Ambulatory Visit: Payer: Self-pay | Admitting: Family Medicine

## 2024-04-17 ENCOUNTER — Encounter: Payer: Self-pay | Admitting: Family Medicine

## 2024-04-17 ENCOUNTER — Ambulatory Visit (INDEPENDENT_AMBULATORY_CARE_PROVIDER_SITE_OTHER): Admitting: Family Medicine

## 2024-04-17 VITALS — BP 115/69 | HR 65 | Temp 98.3°F | Ht 74.0 in | Wt 180.6 lb

## 2024-04-17 DIAGNOSIS — Z125 Encounter for screening for malignant neoplasm of prostate: Secondary | ICD-10-CM | POA: Diagnosis not present

## 2024-04-17 DIAGNOSIS — E7800 Pure hypercholesterolemia, unspecified: Secondary | ICD-10-CM | POA: Diagnosis not present

## 2024-04-17 DIAGNOSIS — Z23 Encounter for immunization: Secondary | ICD-10-CM

## 2024-04-17 DIAGNOSIS — R7301 Impaired fasting glucose: Secondary | ICD-10-CM

## 2024-04-17 DIAGNOSIS — Z0000 Encounter for general adult medical examination without abnormal findings: Secondary | ICD-10-CM

## 2024-04-17 LAB — COMPREHENSIVE METABOLIC PANEL WITH GFR
ALT: 21 U/L (ref 0–53)
AST: 24 U/L (ref 0–37)
Albumin: 4.7 g/dL (ref 3.5–5.2)
Alkaline Phosphatase: 38 U/L — ABNORMAL LOW (ref 39–117)
BUN: 18 mg/dL (ref 6–23)
CO2: 31 meq/L (ref 19–32)
Calcium: 9.7 mg/dL (ref 8.4–10.5)
Chloride: 102 meq/L (ref 96–112)
Creatinine, Ser: 1.03 mg/dL (ref 0.40–1.50)
GFR: 75.46 mL/min (ref >60.00)
Glucose, Bld: 96 mg/dL (ref 70–99)
Potassium: 4.6 meq/L (ref 3.5–5.1)
Sodium: 140 meq/L (ref 135–145)
Total Bilirubin: 0.8 mg/dL (ref 0.2–1.2)
Total Protein: 7.4 g/dL (ref 6.0–8.3)

## 2024-04-17 LAB — LIPID PANEL
Cholesterol: 151 mg/dL (ref 0–200)
HDL: 47.5 mg/dL (ref >39.00)
LDL Cholesterol: 80 mg/dL (ref 0–99)
NonHDL: 103.74
Total CHOL/HDL Ratio: 3
Triglycerides: 120 mg/dL (ref 0.0–149.0)
VLDL: 24 mg/dL (ref 0.0–40.0)

## 2024-04-17 LAB — CBC WITH DIFFERENTIAL/PLATELET
Basophils Absolute: 0 K/uL (ref 0.0–0.1)
Basophils Relative: 0.4 % (ref 0.0–3.0)
Eosinophils Absolute: 0.1 K/uL (ref 0.0–0.7)
Eosinophils Relative: 1.4 % (ref 0.0–5.0)
HCT: 41.3 % (ref 39.0–52.0)
Hemoglobin: 14.1 g/dL (ref 13.0–17.0)
Lymphocytes Relative: 46.5 % — ABNORMAL HIGH (ref 12.0–46.0)
Lymphs Abs: 2.6 K/uL (ref 0.7–4.0)
MCHC: 34.2 g/dL (ref 30.0–36.0)
MCV: 90.6 fl (ref 78.0–100.0)
Monocytes Absolute: 0.4 K/uL (ref 0.1–1.0)
Monocytes Relative: 6.3 % (ref 3.0–12.0)
Neutro Abs: 2.6 K/uL (ref 1.4–7.7)
Neutrophils Relative %: 45.4 % (ref 43.0–77.0)
Platelets: 248 K/uL (ref 150.0–400.0)
RBC: 4.56 Mil/uL (ref 4.22–5.81)
RDW: 12.4 % (ref 11.5–15.5)
WBC: 5.7 K/uL (ref 4.0–10.5)

## 2024-04-17 LAB — T4, FREE: Free T4: 0.79 ng/dL (ref 0.60–1.60)

## 2024-04-17 LAB — HEMOGLOBIN A1C: Hgb A1c MFr Bld: 5.7 % (ref 4.6–6.5)

## 2024-04-17 LAB — TSH: TSH: 3.14 u[IU]/mL (ref 0.35–5.50)

## 2024-04-17 LAB — PSA, MEDICARE: PSA: 4.52 ng/mL — ABNORMAL HIGH (ref 0.10–4.00)

## 2024-04-17 MED ORDER — ATORVASTATIN CALCIUM 20 MG PO TABS: 20.0000 mg | ORAL_TABLET | Freq: Every day | ORAL | 3 refills | Status: AC

## 2024-04-17 MED ORDER — LORATADINE 10 MG PO TABS: 10.0000 mg | ORAL_TABLET | Freq: Every day | ORAL | 3 refills | Status: AC

## 2024-04-17 MED ORDER — TAMSULOSIN HCL 0.4 MG PO CAPS: 0.4000 mg | ORAL_CAPSULE | Freq: Every day | ORAL | 3 refills | Status: AC

## 2024-04-17 MED ORDER — SILDENAFIL CITRATE 20 MG PO TABS: ORAL_TABLET | ORAL | 11 refills | Status: AC

## 2024-04-17 MED ORDER — FAMOTIDINE 40 MG PO TABS: 40.0000 mg | ORAL_TABLET | Freq: Every day | ORAL | 3 refills | Status: AC

## 2024-04-17 NOTE — Patient Instructions (Signed)
 Health Maintenance, Male  Adopting a healthy lifestyle and getting preventive care are important in promoting health and wellness. Ask your health care provider about:  The right schedule for you to have regular tests and exams.  Things you can do on your own to prevent diseases and keep yourself healthy.  What should I know about diet, weight, and exercise?  Eat a healthy diet    Eat a diet that includes plenty of vegetables, fruits, low-fat dairy products, and lean protein.  Do not eat a lot of foods that are high in solid fats, added sugars, or sodium.  Maintain a healthy weight  Body mass index (BMI) is a measurement that can be used to identify possible weight problems. It estimates body fat based on height and weight. Your health care provider can help determine your BMI and help you achieve or maintain a healthy weight.  Get regular exercise  Get regular exercise. This is one of the most important things you can do for your health. Most adults should:  Exercise for at least 150 minutes each week. The exercise should increase your heart rate and make you sweat (moderate-intensity exercise).  Do strengthening exercises at least twice a week. This is in addition to the moderate-intensity exercise.  Spend less time sitting. Even light physical activity can be beneficial.  Watch cholesterol and blood lipids  Have your blood tested for lipids and cholesterol at 67 years of age, then have this test every 5 years.  You may need to have your cholesterol levels checked more often if:  Your lipid or cholesterol levels are high.  You are older than 67 years of age.  You are at high risk for heart disease.  What should I know about cancer screening?  Many types of cancers can be detected early and may often be prevented. Depending on your health history and family history, you may need to have cancer screening at various ages. This may include screening for:  Colorectal cancer.  Prostate cancer.  Skin cancer.  Lung  cancer.  What should I know about heart disease, diabetes, and high blood pressure?  Blood pressure and heart disease  High blood pressure causes heart disease and increases the risk of stroke. This is more likely to develop in people who have high blood pressure readings or are overweight.  Talk with your health care provider about your target blood pressure readings.  Have your blood pressure checked:  Every 3-5 years if you are 67-67 years of age.  Every year if you are 3 years old or older.  If you are between the ages of 60 and 72 and are a current or former smoker, ask your health care provider if you should have a one-time screening for abdominal aortic aneurysm (AAA).  Diabetes  Have regular diabetes screenings. This checks your fasting blood sugar level. Have the screening done:  Once every three years after age 67 if you are at a normal weight and have a low risk for diabetes.  More often and at a younger age if you are overweight or have a high risk for diabetes.  What should I know about preventing infection?  Hepatitis B  If you have a higher risk for hepatitis B, you should be screened for this virus. Talk with your health care provider to find out if you are at risk for hepatitis B infection.  Hepatitis C  Blood testing is recommended for:  Everyone born from [birth year] through 1964.  Anyone  with known risk factors for hepatitis C.  Sexually transmitted infections (STIs)  You should be screened each year for STIs, including gonorrhea and chlamydia, if:  You are sexually active and are younger than 67 years of age.  You are older than 67 years of age and your health care provider tells you that you are at risk for this type of infection.  Your sexual activity has changed since you were last screened, and you are at increased risk for chlamydia or gonorrhea. Ask your health care provider if you are at risk.  Ask your health care provider about whether you are at high risk for HIV. Your health care provider  may recommend a prescription medicine to help prevent HIV infection. If you choose to take medicine to prevent HIV, you should first get tested for HIV. You should then be tested every 3 months for as long as you are taking the medicine.  Follow these instructions at home:  Alcohol use  Do not drink alcohol if your health care provider tells you not to drink.  If you drink alcohol:  Limit how much you have to 0-2 drinks a day.  Know how much alcohol is in your drink. In the [place] one drink equals one 12 oz bottle of beer (355 mL), one 5 oz glass of wine (148 mL), or one 1 oz glass of hard liquor (44 mL).  Lifestyle  Do not use any products that contain nicotine or tobacco. These products include cigarettes, chewing tobacco, and vaping devices, such as e-cigarettes. If you need help quitting, ask your health care provider.  Do not use street drugs.  Do not share needles.  Ask your health care provider for help if you need support or information about quitting drugs.  General instructions  Schedule regular health, dental, and eye exams.  Stay current with your vaccines.  Tell your health care provider if:  You often feel depressed.  You have ever been abused or do not feel safe at home.  Summary  Adopting a healthy lifestyle and getting preventive care are important in promoting health and wellness.  Follow your health care provider's instructions about healthy diet, exercising, and getting tested or screened for diseases.  Follow your health care provider's instructions on monitoring your cholesterol and blood pressure.  This information is not intended to replace advice given to you by your health care provider. Make sure you discuss any questions you have with your health care provider.  Document Revised: 12/01/2020 Document Reviewed: 12/01/2020  Elsevier Patient Education  2024 [employer].

## 2024-04-17 NOTE — Progress Notes (Signed)
 Office Note 04/17/2024  CC:  Chief Complaint  Patient presents with   Annual Exam    HPI:  Patient is a 68 y.o. male who is here for annual health maintenance exam.   In January 2025 his prostate MRI showed no suspicious lesions.Urology follow-up for elevated PSA was done a couple of months ago, PSA stable at 3.56. Released to follow-up with me annually for PSAs.  Steve Cunningham is doing well.   Past Medical History:  Diagnosis Date   BPH with obstruction/lower urinary tract symptoms    Elevated PSA    Mild elevation.  Very large prostate.  January 2025 prostate MRI without suspicious lesion.   Erectile dysfunction    GERD (gastroesophageal reflux disease)    History of adenomatous polyp of colon 2010   Hyperlipidemia    IFG (impaired fasting glucose) 12/2017   Gluc 110.  HbA1c 5.4%   Pain and swelling of left lower leg 10/2018   u/s->complicated baker's cyst vs partial mm tear with hematoma.   Perennial allergic rhinitis    Ulnar nerve neuropathy    Bilat, at elbow    Past Surgical History:  Procedure Laterality Date   COLONOSCOPY  08/08/2008; 08/2015   2010 adenomatous polyp; repeat 08/27/15 no polyps (diverticulosis)--recall 08/2025.   LUMBAR DISC SURGERY  approx 2011   L5-S1 (left); Dr. Gaither Cowboy neurolysis Left 03/2017   Dr. Mavis    Family History  Problem Relation Age of Onset   Hyperlipidemia Mother    Hypertension Mother    Hyperlipidemia Father    Hypertension Father    Colon cancer Paternal Grandmother 59    Social History   Socioeconomic History   Marital status: Married    Spouse name: Not on file   Number of children: Not on file   Years of education: Not on file   Highest education level: Not on file  Occupational History   Not on file  Tobacco Use   Smoking status: Never   Smokeless tobacco: Never  Substance and Sexual Activity   Alcohol use: Yes    Alcohol/week: 7.0 standard drinks of alcohol    Types: 7 Standard drinks or equivalent  per week    Comment: 1 glass wine or beer daily   Drug use: No   Sexual activity: Not on file  Other Topics Concern   Not on file  Social History Narrative   Married, 1 son and 1 daughter, both college age.   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   College/med school: [employer].   No T/A/Ds.         Social Drivers of Corporate investment banker Strain: Not on file  Food Insecurity: Not on file  Transportation Needs: Not on file  Physical Activity: Not on file  Stress: Not on file  Social Connections: Not on file  Intimate Partner Violence: Not on file    Outpatient Medications Prior to Visit  Medication Sig Dispense Refill   atorvastatin  (LIPITOR) 20 MG tablet Take 1 tablet by mouth once daily 30 tablet 0   famotidine  (PEPCID ) 40 MG tablet Take 1 tablet (40 mg total) by mouth daily. Take 1 tablet daily 90 tablet 3   loratadine  (CLARITIN ) 10 MG tablet Take 1 tablet (10 mg total) by mouth daily. 90 tablet 3   sildenafil  (REVATIO ) 20 MG tablet 2 tabs po qd prn 20 tablet 11   tamsulosin  (FLOMAX ) 0.4 MG CAPS capsule Take 1 capsule by mouth once  daily 30 capsule 0   No facility-administered medications prior to visit.    No Known Allergies  Review of Systems  Constitutional:  Negative for appetite change, chills, fatigue and fever.  HENT:  Negative for congestion, dental problem, ear pain and sore throat.   Eyes:  Negative for discharge, redness and visual disturbance.  Respiratory:  Negative for cough, chest tightness, shortness of breath and wheezing.   Cardiovascular:  Negative for chest pain, palpitations and leg swelling.  Gastrointestinal:  Negative for abdominal pain, blood in stool, diarrhea, nausea and vomiting.  Genitourinary:  Negative for difficulty urinating, dysuria, flank pain, frequency, hematuria and urgency.  Musculoskeletal:  Negative for arthralgias, back pain, joint swelling, myalgias and neck stiffness.  Skin:  Negative  for pallor and rash.  Neurological:  Negative for dizziness, speech difficulty, weakness and headaches.  Hematological:  Negative for adenopathy. Does not bruise/bleed easily.  Psychiatric/Behavioral:  Negative for confusion and sleep disturbance. The patient is not nervous/anxious.     PE;    04/17/2024   11:16 AM 04/15/2023    8:29 AM 04/09/2022    9:36 AM  Vitals with BMI  Height 6' 2 6' 3.5 6' 3.5  Weight 180 lbs 10 oz 181 lbs 6 oz 178 lbs  BMI 23.18 22.37 21.95  Systolic 115 98 101  Diastolic 69 56 61  Pulse 65 77 66   Gen: Alert, well appearing.  Patient is oriented to person, place, time, and situation. AFFECT: pleasant, lucid thought and speech. ENT: Ears: EACs clear, normal epithelium.  TMs with good light reflex and landmarks bilaterally.  Eyes: no injection, icteris, swelling, or exudate.  EOMI, PERRLA. Nose: no drainage or turbinate edema/swelling.  No injection or focal lesion.  Mouth: lips without lesion/swelling.  Oral mucosa pink and moist.  Dentition intact and without obvious caries or gingival swelling.  Oropharynx without erythema, exudate, or swelling.  Neck: supple/nontender.  No LAD, mass, or TM.  Carotid pulses 2+ bilaterally, without bruits. CV: RRR, no m/r/g.   LUNGS: CTA bilat, nonlabored resps, good aeration in all lung fields. ABD: soft, NT, ND, BS normal.  No hepatospenomegaly or mass.  No bruits. EXT: no clubbing, cyanosis, or edema.  Musculoskeletal: no joint swelling, erythema, warmth, or tenderness.  ROM of all joints intact. Skin - no sores or suspicious lesions or rashes or color changes  Pertinent labs:  Lab Results  Component Value Date   TSH 2.970 04/13/2023   Lab Results  Component Value Date   WBC 6.0 04/13/2023   HGB 14.1 04/13/2023   HCT 42.1 04/13/2023   MCV 94 04/13/2023   PLT 251 04/13/2023   Lab Results  Component Value Date   CREATININE 1.17 04/13/2023   BUN 19 04/13/2023   NA 141 04/13/2023   K 4.4 04/13/2023   CL 101  04/13/2023   CO2 25 04/13/2023   Lab Results  Component Value Date   ALT 26 04/13/2023   AST 22 04/13/2023   ALKPHOS 43 (L) 04/13/2023   BILITOT 0.6 04/13/2023   Lab Results  Component Value Date   CHOL 159 04/13/2023   Lab Results  Component Value Date   HDL 45 04/13/2023   Lab Results  Component Value Date   LDLCALC 90 04/13/2023   Lab Results  Component Value Date   TRIG 134 04/13/2023   Lab Results  Component Value Date   CHOLHDL 3.5 04/13/2023   Lab Results  Component Value Date   PSA 3.9 11/10/2021  PSA 3.5 10/10/2019   PSA 2.49 10/20/2017   Lab Results  Component Value Date   HGBA1C 5.2 04/15/2023   HGBA1C 5.2 04/15/2023   HGBA1C 5.2 (A) 04/15/2023   HGBA1C 5.2 04/15/2023   ASSESSMENT AND PLAN:   #1 health maintenance exam: Reviewed age and gender appropriate health maintenance issues (prudent diet, regular exercise, health risks of tobacco and excessive alcohol, use of seatbelts, fire alarms in home, use of sunscreen).  Also reviewed age and gender appropriate health screening as well as vaccine recommendations. Vaccines: Flu->given today.  Prevnar 20-->given today. Labs: Health panel labs and PSA. Prostate ca screening: PSA today. Colon ca screening: recall 2027  #2 hypercholesterolemia, doing well on the atorvastatin  20 mg a day. Lipid panel and hepatic panel today.  3.  BPH with lower urinary tract obstructive symptoms and history of elevated PSA. Prostate MRI reassuring. Subsequent PSA improved/normal. Urology released him to annual PSA monitoring with me. Check PSA today. Refilled tamsulosin  0.4 mg nightly today.  An After Visit Summary was printed and given to the patient.  FOLLOW UP:  No follow-ups on file.  Signed:  Gerlene Hockey, MD           04/17/2024

## 2024-04-18 ENCOUNTER — Ambulatory Visit: Payer: Self-pay | Admitting: Family Medicine

## 2024-04-18 DIAGNOSIS — R972 Elevated prostate specific antigen [PSA]: Secondary | ICD-10-CM

## 2024-04-18 LAB — T3: T3, Total: 126 ng/dL (ref 76–181)
